# Patient Record
Sex: Female | Born: 1975 | Race: Black or African American | Hispanic: No | Marital: Single | Smoking: Never smoker
Health system: Southern US, Community
[De-identification: ages and names within clinical notes are randomized; demographics above are authoritative.]

## PROBLEM LIST (undated history)

## (undated) DIAGNOSIS — L709 Acne, unspecified: Secondary | ICD-10-CM

## (undated) DIAGNOSIS — L309 Dermatitis, unspecified: Secondary | ICD-10-CM

## (undated) HISTORY — DX: Dermatitis, unspecified: L30.9

## (undated) HISTORY — DX: Acne, unspecified: L70.9

---

## 1999-01-21 ENCOUNTER — Other Ambulatory Visit: Admission: RE | Admit: 1999-01-21 | Discharge: 1999-01-21 | Payer: Self-pay | Admitting: Family Medicine

## 1999-07-05 ENCOUNTER — Other Ambulatory Visit: Admission: RE | Admit: 1999-07-05 | Discharge: 1999-07-05 | Payer: Self-pay | Admitting: Obstetrics and Gynecology

## 2000-01-30 ENCOUNTER — Inpatient Hospital Stay (HOSPITAL_COMMUNITY): Admission: AD | Admit: 2000-01-30 | Discharge: 2000-02-03 | Payer: Self-pay | Admitting: Obstetrics & Gynecology

## 2000-01-30 ENCOUNTER — Inpatient Hospital Stay (HOSPITAL_COMMUNITY): Admission: AD | Admit: 2000-01-30 | Discharge: 2000-01-30 | Payer: Self-pay | Admitting: Obstetrics & Gynecology

## 2000-06-15 ENCOUNTER — Other Ambulatory Visit: Admission: RE | Admit: 2000-06-15 | Discharge: 2000-06-15 | Payer: Self-pay | Admitting: Family Medicine

## 2001-06-11 ENCOUNTER — Other Ambulatory Visit: Admission: RE | Admit: 2001-06-11 | Discharge: 2001-06-11 | Payer: Self-pay | Admitting: Family Medicine

## 2002-08-27 ENCOUNTER — Other Ambulatory Visit: Admission: RE | Admit: 2002-08-27 | Discharge: 2002-08-27 | Payer: Self-pay | Admitting: Family Medicine

## 2003-03-24 ENCOUNTER — Emergency Department (HOSPITAL_COMMUNITY): Admission: AD | Admit: 2003-03-24 | Discharge: 2003-03-24 | Payer: Self-pay | Admitting: Family Medicine

## 2003-04-25 ENCOUNTER — Emergency Department (HOSPITAL_COMMUNITY): Admission: AD | Admit: 2003-04-25 | Discharge: 2003-04-25 | Payer: Self-pay | Admitting: Family Medicine

## 2003-09-16 ENCOUNTER — Other Ambulatory Visit: Admission: RE | Admit: 2003-09-16 | Discharge: 2003-09-16 | Payer: Self-pay | Admitting: Family Medicine

## 2004-09-14 ENCOUNTER — Other Ambulatory Visit: Admission: RE | Admit: 2004-09-14 | Discharge: 2004-09-14 | Payer: Self-pay | Admitting: Family Medicine

## 2005-02-10 ENCOUNTER — Emergency Department (HOSPITAL_COMMUNITY): Admission: EM | Admit: 2005-02-10 | Discharge: 2005-02-10 | Payer: Self-pay | Admitting: Emergency Medicine

## 2005-11-15 ENCOUNTER — Other Ambulatory Visit: Admission: RE | Admit: 2005-11-15 | Discharge: 2005-11-15 | Payer: Self-pay | Admitting: Family Medicine

## 2007-01-18 ENCOUNTER — Other Ambulatory Visit: Admission: RE | Admit: 2007-01-18 | Discharge: 2007-01-18 | Payer: Self-pay | Admitting: Family Medicine

## 2007-02-20 ENCOUNTER — Telehealth (INDEPENDENT_AMBULATORY_CARE_PROVIDER_SITE_OTHER): Payer: Self-pay | Admitting: *Deleted

## 2007-04-17 ENCOUNTER — Ambulatory Visit: Payer: Self-pay | Admitting: Internal Medicine

## 2008-01-18 ENCOUNTER — Other Ambulatory Visit: Admission: RE | Admit: 2008-01-18 | Discharge: 2008-01-18 | Payer: Self-pay | Admitting: Family Medicine

## 2008-05-18 ENCOUNTER — Emergency Department (HOSPITAL_COMMUNITY): Admission: EM | Admit: 2008-05-18 | Discharge: 2008-05-18 | Payer: Self-pay | Admitting: Family Medicine

## 2009-01-20 ENCOUNTER — Other Ambulatory Visit: Admission: RE | Admit: 2009-01-20 | Discharge: 2009-01-20 | Payer: Self-pay | Admitting: Family Medicine

## 2009-09-16 ENCOUNTER — Ambulatory Visit: Payer: Self-pay | Admitting: Nurse Practitioner

## 2009-09-16 DIAGNOSIS — B354 Tinea corporis: Secondary | ICD-10-CM | POA: Insufficient documentation

## 2010-02-17 ENCOUNTER — Other Ambulatory Visit: Admission: RE | Admit: 2010-02-17 | Discharge: 2010-02-17 | Payer: Self-pay | Admitting: Family Medicine

## 2010-03-17 ENCOUNTER — Ambulatory Visit: Payer: Self-pay | Admitting: Internal Medicine

## 2010-07-08 NOTE — Letter (Signed)
Summary: Handout Printed  Printed Handout:  - Ringworm - Body (Tinea Corporis) 

## 2010-07-08 NOTE — Assessment & Plan Note (Signed)
Summary: Acute - Tinea Corporis   Vital Signs:  Patient profile:   35 year old female Height:      64 inches Weight:      118 pounds BMI:     20.33 Temp:     98.2 degrees F oral Pulse rate:   72 / minute Pulse rhythm:   regular Resp:     18 per minute BP sitting:   102 / 68  (left arm) Cuff size:   regular  Vitals Entered By: [place] Shannon (September 16, 2009 12:28 PM) CC: pt says she has a rash/ring worm.... pt says she noticed the rash last week... pt says it don't itch..., Rash Is Patient Diabetic? No Pain Assessment Patient in pain? no       Does patient need assistance? Functional Status Self care Ambulation Normal   CC:  pt says she has a rash/ring worm.... pt says she noticed the rash last week... pt says it don't itch... and Rash.  History of Present Illness:  Pt into the office with c/o rash  Rash      This is a 35 year old woman who presents with Rash.  The symptoms began 1 week ago.  The patient denies redness and tenderness.  The rash is located on the abdomen.  Associated symptoms does not include abdominal pain.  The patient denies the following symptoms: fever, facial swelling, tongue swelling, and difficulty breathing.  She has not used any otc medications for the rash.  Allergies (verified): No Known Drug Allergies  Review of Systems CV:  Denies chest pain or discomfort. Resp:  Denies cough. GI:  Denies abdominal pain. Derm:  Complains of lesion(s).  Physical Exam  General:  alert.   Head:  normocephalic.   Lungs:  normal breath sounds.   Heart:  normal rate and regular rhythm.   Skin:  left flank  5cm - circumscribed lesion, borders erythematous with flaking, central clearing   Impression & Recommendations:  Problem # 1:  TINEA CORPORIS (ICD-110.5) advised pt of dx may use otc cream to affected area - needs to use at least daily  Complete Medication List: 1)  Epipen 0.3 Mg/0.58ml (1:1000) Devi (Epinephrine hcl (anaphylaxis)) .... Use as  directed as needed for severe reaction to bee sting 2)  Lotrimin Af 1 % Crea (Clotrimazole) .... One application topically for 2 weeks  Patient Instructions: 1)  Use lotrimin Cream daily to area for the next 2 weeks 2)  You can purchase this over the counter 3)  Follow up for a complete physical exam. 4)  Come fasting after midnight before this appointment

## 2010-10-22 NOTE — Op Note (Signed)
[facility]  Patient:    Connie [name], Connie [name]                        MRN: [id number] Proc. Date: 01/31/00 Adm. Date:  [id number] Disc. Date: [id number] Attending:  Cleatrice Burke                           Operative Report  PREOPERATIVE DIAGNOSES:       1. Intrauterine pregnancy term.                               2. Second stage arrest in dilatation and descent.  POSTOPERATIVE DIAGNOSES:      1. Intrauterine pregnancy term.                               2. Second stage arrest in dilatation and descent.                               3. Occiput posterior and cephalopelvic disproportion.  OPERATION PERFORMED:          Primary low cervical transverse cesarean section.  SURGEON:                      Georgina Peer, M.D.  ASSISTANT:                    ______ Andrey Campanile, CNM.  ESTIMATED BLOOD LOSS:         500 cc.  ANESTHESIA:                   Spinal.  Dr. Arby Barrette.  FINDINGS:                     A 9 pounds 0 ounce female.  Apgars 9 and 9. Normal tubes and ovaries.  INDICATION:                   A 35 year old gravida 3, para 0-0-2-0, [facility] January 28, 2000.  Presented in early labor.  Contractions were augmented with Pitocin.  She reached full dilatation and approximately 11 a.m. pushed until 1330 without descent of the head past +1 station.  There was ______ molding, an android pelvis, and a suspect CPD.  After informed consent the patient was taken to the operating room for cesarean section.  Risks and complications including bleeding, infection, increased blood clot risk, injury to abdominal structures was all discussed and accepted.  DESCRIPTION OF PROCEDURE:     Patient was taken to the operating room, given a spinal by Dr. Arby Barrette, placed in the dorsal supine left lateral uterine displacement position.  Foley catheter put in place in her bladder.  Abdomen was prepped and draped sterilely.  After adequate anesthesia was documented, a transverse  incision was made through the subcutaneous tissue, skin, and fascia.  The abdomen was entered in the normal Pfannenstiel manner.  A transverse bladder flap was created.  A transverse uterine incision was also created.  What appeared to have been meconium stained fluid was felt to be almost negligible at the time of the uterine incision.  The incision was widened with bandage scissors and a female infant was delivered from the OP position, rotated to  OA, and delivered with the aid of fundal pressure.  Mouth and nose were suctioned with ______ trap and bulb suctioned.  Infant cried spontaneously.  The cord was doubly clamped and cut.  The infant was handed to the pediatric team in attendance.  Cord blood was taken.  Placenta and membranes removed.  The uterus contracted well with Pitocin.  There were no extensions.  The uterus was closed in two layers of running locked Vicryl with a second embrocating suture of Vicryl.  Tubes and ovaries inspected and found to be normal.  Bleeding along the edges of the incision were controlled with interrupted and figure-of-eight sutures of Vicryl.  The rectus muscles were approximated in the midline with a Vicryl suture to cover the bladder.  The fascia was closed with Vicryl suture with the knots buried.  Subcutaneous tissue cauterized for any bleeders.  Skin was closed with staples after the subcu was copiously irrigated with saline.  Sponge, needle, and instrument counts were correct.  Patient tolerated the procedure well and was sent to recovery area in stable condition.  The infant went to regular nursery. DD:  01/31/00 TD:  01/31/00 Job: [id number]

## 2010-10-22 NOTE — Discharge Summary (Signed)
[facility]  Patient:    Connie [name], Connie [name]                        MRN: [id number] Adm. Date:  [id number] Disc. Date: [id number] Attending:  Cleatrice Burke Dictator:   Wynelle Bourgeois, C.N.M.                           Discharge Summary  ADMISSION DIAGNOSES:          1. Intrauterine pregnancy at 40-2/7 weeks.                               2. Labor.                               3. Group B Streptococcus unknown.  DISCHARGE DIAGNOSES:          1. Intrauterine pregnancy at 40-2/7 weeks.                               2. Labor.                               3. Group B Streptococcus unknown.                               4. Primary low transverse cesarean section.                               5. Second stage arrest.                               6. Occipitoposterior presentation.  PROCEDURES:                   1. Amniotomy.                               2. IUPC.                               3. Primary low transverse cesarean section.                               4. Spinal anesthesia.  HOSPITAL COURSE:              The patient is a 35 year old G3, P0-0-2-0 at 40-2/7 weeks who presented in labor on January 30, 2000, at 3 to 4 cm and 0 station.  Membranes were intact at that time, and she was admitted to the physicians service for labor.  She utilized IV pain medicines during labor and progressed well to 5 cm at 4:15 a.m. at which time amniotomy as performed and IUPC was placed.  Later on that morning at 8:15 a.m. on August 27, the cervical exam progressed to rim of cervix with -1 station, and Pitocin augmentation was begun.  She pushed for several hours, and the fetal vertex  started to display caput and molding.  An acute suprapubic arch was noted. Dr. Elliot Gault felt the patient had expended good effort without descent of the vertex past +1 station.  Therefore, a diagnosis of active phase arrest was determined, and the patient was taken to the operating room  for a primary low transverse cesarean section for second stage arrest and occipitoposterior position under spinal anesthesia.  EBL was 500 cc, and there were no complications.  Findings included a female infant weighing 9 pounds, and Apgars were 9/9.  The patient was taken to recovery.  Postoperative stay was unremarkable except for a one-time fever on postop day #1 of 102.4.  She was treated with fluids and Tylenol and had no further elevations in her temperature.  She was also placed on Unasyn for antibiotic coverage.  Urine was sent for culture.  On postop day #2, her status was stable.  She had clear lungs and regular heart rate.  Incision was clean and dry.  Lochia was scant.  Extremities were within normal limits.  Temperatures were within normal limits.  On postop day #3, she was without complaint.  She was afebrile with a maximum temperature of 100.1 at midnight.  Abdomen was soft.  Incision was clear.  Physical exam was within normal limits, and she was deemed to have received full benefit of her hospital stay and was discharged to home after her staples were removed by Dr. Pennie Rushing.  DISCHARGE MEDICATIONS:        1. Motrin 600 mg p.o. q.6h. p.r.n.                               2. Tylox 1 to 2 p.o. q.4h. p.r.n.                               3. Hemocyte 1 q.d.                               4. Prenatal vitamins 1 q.d.  DISCHARGE LABORATORY DATA:    RPR nonreactive.  White blood cell count 14.0, hemoglobin 8.1, hematocrit 23.6, platelets 155.  DISCHARGE INSTRUCTIONS:       Per [facility] handout.  DISCHARGE FOLLOWUP:           In six weeks at [facility]. DD:  02/03/00 TD:  02/04/00 Job: [id number]
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
## 2011-02-24 ENCOUNTER — Other Ambulatory Visit: Payer: Self-pay | Admitting: Family Medicine

## 2011-02-24 ENCOUNTER — Other Ambulatory Visit (HOSPITAL_COMMUNITY)
Admission: RE | Admit: 2011-02-24 | Discharge: 2011-02-24 | Disposition: A | Discharge status: Home / Self Care | Payer: Medicaid Other | Source: Ambulatory Visit | Attending: Family Medicine | Admitting: Family Medicine

## 2011-02-24 DIAGNOSIS — Z124 Encounter for screening for malignant neoplasm of cervix: Secondary | ICD-10-CM | POA: Insufficient documentation

## 2011-03-11 LAB — GC/CHLAMYDIA PROBE AMP, GENITAL
Chlamydia, DNA Probe: NEGATIVE
GC Probe Amp, Genital: NEGATIVE

## 2011-03-11 LAB — POCT PREGNANCY, URINE: Preg Test, Ur: NEGATIVE

## 2011-03-11 LAB — POCT URINALYSIS DIP (DEVICE)
Glucose, UA: NEGATIVE mg/dL
Nitrite: NEGATIVE
Specific Gravity, Urine: 1.025 (ref 1.005–1.030)
Urobilinogen, UA: 0.2 mg/dL (ref 0.0–1.0)

## 2011-03-11 LAB — WET PREP, GENITAL
Trich, Wet Prep: NONE SEEN
Yeast Wet Prep HPF POC: NONE SEEN

## 2012-02-27 ENCOUNTER — Other Ambulatory Visit: Payer: Self-pay | Admitting: Family Medicine

## 2012-02-27 ENCOUNTER — Other Ambulatory Visit (HOSPITAL_COMMUNITY)
Admission: RE | Admit: 2012-02-27 | Discharge: 2012-02-27 | Disposition: A | Discharge status: Home / Self Care | Payer: Medicaid Other | Source: Ambulatory Visit | Attending: Family Medicine | Admitting: Family Medicine

## 2012-02-27 DIAGNOSIS — Z124 Encounter for screening for malignant neoplasm of cervix: Secondary | ICD-10-CM | POA: Insufficient documentation

## 2013-03-19 ENCOUNTER — Other Ambulatory Visit: Payer: Self-pay | Admitting: Physician Assistant

## 2013-03-19 ENCOUNTER — Other Ambulatory Visit (HOSPITAL_COMMUNITY)
Admission: RE | Admit: 2013-03-19 | Discharge: 2013-03-19 | Disposition: A | Discharge status: Home / Self Care | Payer: Medicaid Other | Source: Ambulatory Visit | Attending: Family Medicine | Admitting: Family Medicine

## 2013-03-19 DIAGNOSIS — Z124 Encounter for screening for malignant neoplasm of cervix: Secondary | ICD-10-CM | POA: Insufficient documentation

## 2016-03-28 ENCOUNTER — Other Ambulatory Visit: Payer: Self-pay | Admitting: Family Medicine

## 2016-03-28 ENCOUNTER — Other Ambulatory Visit (HOSPITAL_COMMUNITY)
Admission: RE | Admit: 2016-03-28 | Discharge: 2016-03-28 | Disposition: A | Discharge status: Home / Self Care | Payer: BLUE CROSS/BLUE SHIELD | Source: Ambulatory Visit | Attending: Family Medicine | Admitting: Family Medicine

## 2016-03-28 DIAGNOSIS — Z124 Encounter for screening for malignant neoplasm of cervix: Secondary | ICD-10-CM | POA: Insufficient documentation

## 2016-03-29 ENCOUNTER — Other Ambulatory Visit: Payer: Self-pay | Admitting: Family Medicine

## 2016-03-29 DIAGNOSIS — Z0000 Encounter for general adult medical examination without abnormal findings: Secondary | ICD-10-CM

## 2016-03-30 LAB — CYTOLOGY - PAP: DIAGNOSIS: NEGATIVE

## 2016-04-06 ENCOUNTER — Ambulatory Visit
Admission: RE | Admit: 2016-04-06 | Discharge: 2016-04-06 | Disposition: A | Discharge status: Home / Self Care | Payer: BLUE CROSS/BLUE SHIELD | Source: Ambulatory Visit | Attending: Family Medicine | Admitting: Family Medicine

## 2016-04-06 DIAGNOSIS — Z0000 Encounter for general adult medical examination without abnormal findings: Secondary | ICD-10-CM

## 2017-01-18 ENCOUNTER — Other Ambulatory Visit: Payer: Self-pay | Admitting: Family Medicine

## 2017-01-18 DIAGNOSIS — Z1231 Encounter for screening mammogram for malignant neoplasm of breast: Secondary | ICD-10-CM

## 2017-04-10 ENCOUNTER — Ambulatory Visit: Payer: BLUE CROSS/BLUE SHIELD

## 2017-04-20 ENCOUNTER — Ambulatory Visit: Payer: BLUE CROSS/BLUE SHIELD

## 2017-04-25 ENCOUNTER — Ambulatory Visit
Admission: RE | Admit: 2017-04-25 | Discharge: 2017-04-25 | Disposition: A | Discharge status: Home / Self Care | Payer: BLUE CROSS/BLUE SHIELD | Source: Ambulatory Visit | Attending: Family Medicine | Admitting: Family Medicine

## 2017-04-25 DIAGNOSIS — Z1231 Encounter for screening mammogram for malignant neoplasm of breast: Secondary | ICD-10-CM

## 2018-05-24 ENCOUNTER — Other Ambulatory Visit: Payer: Self-pay | Admitting: Family Medicine

## 2018-05-24 DIAGNOSIS — Z1231 Encounter for screening mammogram for malignant neoplasm of breast: Secondary | ICD-10-CM

## 2018-05-28 ENCOUNTER — Ambulatory Visit
Admission: RE | Admit: 2018-05-28 | Discharge: 2018-05-28 | Disposition: A | Discharge status: Home / Self Care | Payer: BLUE CROSS/BLUE SHIELD | Source: Ambulatory Visit | Attending: Family Medicine | Admitting: Family Medicine

## 2018-05-28 DIAGNOSIS — Z1231 Encounter for screening mammogram for malignant neoplasm of breast: Secondary | ICD-10-CM

## 2018-05-31 ENCOUNTER — Ambulatory Visit: Payer: BLUE CROSS/BLUE SHIELD

## 2018-07-31 ENCOUNTER — Encounter (HOSPITAL_COMMUNITY): Payer: Self-pay | Admitting: Emergency Medicine

## 2018-07-31 ENCOUNTER — Other Ambulatory Visit: Payer: Self-pay

## 2018-07-31 ENCOUNTER — Emergency Department (HOSPITAL_COMMUNITY)
Admission: EM | Admit: 2018-07-31 | Discharge: 2018-07-31 | Disposition: A | Discharge status: Home / Self Care | Payer: BLUE CROSS/BLUE SHIELD | Attending: Emergency Medicine | Admitting: Emergency Medicine

## 2018-07-31 DIAGNOSIS — Z209 Contact with and (suspected) exposure to unspecified communicable disease: Secondary | ICD-10-CM | POA: Insufficient documentation

## 2018-07-31 DIAGNOSIS — J069 Acute upper respiratory infection, unspecified: Secondary | ICD-10-CM

## 2018-07-31 LAB — INFLUENZA PANEL BY PCR (TYPE A & B)
INFLAPCR: NEGATIVE
Influenza B By PCR: NEGATIVE

## 2018-07-31 LAB — GROUP A STREP BY PCR: Group A Strep by PCR: NOT DETECTED

## 2018-07-31 MED ORDER — BENZONATATE 100 MG PO CAPS: 100.0000 mg | ORAL_CAPSULE | Freq: Three times a day (TID) | ORAL | 0 refills | Status: DC

## 2018-07-31 NOTE — ED Provider Notes (Signed)
[facility]-EMERGENCY DEPT Provider Note   CSN: [id number] Arrival date & time: 07/31/18  1206    History   Chief Complaint Chief Complaint  Patient presents with  . Generalized Body Aches  . Cough    HPI Connie [name] is a 43 y.o. female who presents to the ED with complaint of cough and congestion, chills and fever that started 2 days ago and is getting worse. The cough is productive. 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      The history is provided by the patient. No language interpreter was used.  Cough  Cough characteristics:  Productive Sputum characteristics:  White Severity:  Moderate Onset quality:  Gradual Duration:  2 days Progression:  Worsening Chronicity:  New Smoker: no   Context: sick contacts   Relieved by:  Nothing Worsened by:  Nothing Ineffective treatments: OTC medications. Associated symptoms: chills, headaches, myalgias and sore throat   Associated symptoms: no ear pain, no fever, no rash, no shortness of breath, no sinus congestion and no wheezing     History reviewed. No pertinent past medical history.  Patient Active Problem List   Diagnosis Date Noted  . TINEA CORPORIS 09/16/2009    History reviewed. No pertinent surgical history.   OB History   No obstetric history on file.      Home Medications    Prior to Admission medications   Medication Sig Start Date End Date Taking? Authorizing Provider  benzonatate (TESSALON) 100 MG capsule Take 1 capsule (100 mg total) by mouth every 8 (eight) hours. 07/31/18   Janne Napoleon, NP    Family History Family History  Problem Relation Age of Onset  . Breast cancer Maternal Grandmother     Social History Social History   Tobacco Use  . Smoking status: Not on file  Substance Use Topics  . Alcohol use: Not on file  . Drug use: Not on file     Allergies   Patient has no known allergies.   Review of Systems Review of Systems    Constitutional: Positive for chills. Negative for fever.  HENT: Positive for congestion and sore throat. Negative for ear pain.   Eyes: Negative for visual disturbance.  Respiratory: Positive for cough. Negative for shortness of breath and wheezing.   Gastrointestinal: Negative for abdominal pain, diarrhea, nausea and vomiting.  Genitourinary: Negative for dysuria, frequency and urgency.  Musculoskeletal: Positive for myalgias.  Skin: Negative for rash.  Neurological: Positive for headaches.  Psychiatric/Behavioral: Negative for confusion.     Physical Exam Updated Vital Signs BP 101/78 (BP Location: Left Arm)   Pulse (!) 110   Temp 98.3 F (36.8 C) (Oral)   Resp 17   Ht 5\' 4"  (1.626 m)   Wt 52.2 kg   LMP 07/12/2018   SpO2 100%   BMI 19.74 kg/m   Physical Exam Vitals signs and nursing note reviewed.  Constitutional:      General: She is not in acute distress.    Appearance: She is well-developed.  HENT:     Head: Normocephalic and atraumatic.     Right Ear: Tympanic membrane normal.     Left Ear: Tympanic membrane normal.     Nose: Congestion present.     Mouth/Throat:     Mouth: Mucous membranes are moist.  Eyes:     Extraocular Movements: Extraocular movements intact.     Conjunctiva/sclera: Conjunctivae normal.  Neck:  Musculoskeletal: Neck supple.  Cardiovascular:     Rate and Rhythm: Normal rate and regular rhythm.  Pulmonary:     Effort: Pulmonary effort is normal. No respiratory distress.     Breath sounds: No wheezing or rales.  Abdominal:     Palpations: Abdomen is soft.     Tenderness: There is no abdominal tenderness.  Musculoskeletal: Normal range of motion.  Skin:    General: Skin is warm and dry.  Neurological:     Mental Status: She is alert and oriented to person, place, and time.  Psychiatric:        Mood and Affect: Mood normal.      ED Treatments / Results  Labs (all labs ordered are listed, but only abnormal results are  displayed) Labs Reviewed  GROUP A STREP BY PCR  INFLUENZA PANEL BY PCR (TYPE A & B)  Radiology No results found.  Procedures Procedures (including critical care time)  Medications Ordered in ED Medications - No data to display   Initial Impression / Assessment and Plan / ED Course  I have reviewed the triage vital signs and the nursing notes. Patients symptoms are consistent with URI, likely viral etiology. Discussed that antibiotics are not indicated for viral infections. Pt will be discharged with symptomatic treatment.  Verbalizes understanding and is agreeable with plan. Pt is hemodynamically stable & in NAD prior to dc.  Final Clinical Impressions(s) / ED Diagnoses   Final diagnoses:  Acute URI    ED Discharge Orders         Ordered    benzonatate (TESSALON) 100 MG capsule  Every 8 hours     07/31/18 1630           Damian Leavell [place], NP 07/31/18 2250    Terrilee Files, MD 08/01/18 [phone]

## 2018-07-31 NOTE — Discharge Instructions (Addendum)
Rest, drink plenty of fluids and follow up with your doctor. Return here for worsening symptoms.

## 2018-07-31 NOTE — ED Triage Notes (Signed)
Patient c/o productive cough, body aches, and chills x2 days.

## 2018-12-24 ENCOUNTER — Other Ambulatory Visit: Payer: Self-pay

## 2018-12-24 ENCOUNTER — Ambulatory Visit (INDEPENDENT_AMBULATORY_CARE_PROVIDER_SITE_OTHER): Payer: Managed Care, Other (non HMO) | Admitting: Internal Medicine

## 2018-12-24 ENCOUNTER — Encounter: Payer: Self-pay | Admitting: Internal Medicine

## 2018-12-24 VITALS — BP 102/64 | HR 73 | Temp 98.5°F | Ht 63.5 in | Wt 116.0 lb

## 2018-12-24 DIAGNOSIS — E559 Vitamin D deficiency, unspecified: Secondary | ICD-10-CM | POA: Diagnosis not present

## 2018-12-24 DIAGNOSIS — Z0000 Encounter for general adult medical examination without abnormal findings: Secondary | ICD-10-CM

## 2018-12-24 DIAGNOSIS — Z23 Encounter for immunization: Secondary | ICD-10-CM

## 2018-12-24 NOTE — Progress Notes (Signed)
HPI  Patient presents to the clinic today to establish care.  She was seen by Dr. Valentina LucksGriffin at [facility] previously.    Flu: 02/2018 Tetanus: unsure Pap [facility] 03[facility] Mammogram: 05/2018 Vision: as needed Dentist: Biannually  Diet: She does eat meat. She consumes fruits and veggies daily. She does eat fried foods .She drinks mostly water. Exercise: None, ride bike 4 x week  No past medical history on file.  Current Outpatient Medications  Medication Sig Dispense Refill  . Ascorbic Acid (VITAMIN C) 1000 MG tablet Take 1,000 mg by mouth daily.     No current facility-administered medications for this visit.     No Known Allergies  Family History  Problem Relation Age of Onset  . Breast cancer Maternal Grandmother   . Thyroid cancer Mother   . Hypertension Father     Social History   Socioeconomic History  . Marital status: Single    Spouse name: Not on file  . Number of children: Not on file  . Years of education: Not on file  . Highest education level: Not on file  Occupational History  . Not on file  Social Needs  . Financial resource strain: Not on file  . Food insecurity    Worry: Not on file    Inability: Not on file  . Transportation needs    Medical: Not on file    Non-medical: Not on file  Tobacco Use  . Smoking status: Never Smoker  . Smokeless tobacco: Never Used  Substance and Sexual Activity  . Alcohol use: Yes    Comment: occasional  . Drug use: Never  . Sexual activity: Not on file  Lifestyle  . Physical activity    Days per week: Not on file    Minutes per session: Not on file  . Stress: Not on file  Relationships  . Social Musicianconnections    Talks on phone: Not on file    Gets together: Not on file    Attends religious service: Not on file    Active member of club or organization: Not on file    Attends meetings of clubs or organizations: Not on file    Relationship status: Not on file  . Intimate partner violence   Fear of current or ex partner: Not on file    Emotionally abused: Not on file    Physically abused: Not on file    Forced sexual activity: Not on file  Other Topics Concern  . Not on file  Social History Narrative  . Not on file    ROS:  Constitutional: Denies fever, malaise, fatigue, headache or abrupt weight changes.  HEENT: Denies eye pain, eye redness, ear pain, ringing in the ears, wax buildup, runny nose, nasal congestion, bloody nose, or sore throat. Respiratory: Denies difficulty breathing, shortness of breath, cough or sputum production.   Cardiovascular: Denies chest pain, chest tightness, palpitations or swelling in the hands or feet.  Gastrointestinal: Denies abdominal pain, bloating, constipation, diarrhea or blood in the stool.  GU: Denies frequency, urgency, pain with urination, blood in urine, odor or discharge. Musculoskeletal: Denies decrease in range of motion, difficulty with gait, muscle pain or joint pain and swelling.  Skin: Denies redness, rashes, lesions or ulcercations.  Neurological: Denies dizziness, difficulty with memory, difficulty with speech or problems with balance and coordination.  Psych: Denies anxiety, depression, SI/HI.  No other specific complaints in a complete review of systems (except as listed in HPI above).  PE:  BP 102/64   Pulse 73   Temp 98.5 F (36.9 C) (Temporal)   Ht 5' 3.5" (1.613 m)   Wt 116 lb (52.6 kg)   LMP 12/22/2018   SpO2 97%   BMI 20.23 kg/m  Wt Readings from Last 3 Encounters:  12/24/18 116 lb (52.6 kg)  07/31/18 115 lb (52.2 kg)    General: Appears her stated age, well developed, well nourished in NAD. HEENT: Head: normal shape and size; Eyes: sclera white, no icterus, conjunctiva pink, PERRLA and EOMs intact; Ears: Tm's gray and intact, normal light reflex; Neck: Neck supple, trachea midline. No masses, lumps or thyromegaly present.  Cardiovascular: Normal rate and rhythm. S1,S2 noted.  No murmur, rubs or  gallops noted. No JVD or BLE edema.  Pulmonary/Chest: Normal effort and positive vesicular breath sounds. No respiratory distress. No wheezes, rales or ronchi noted.  Abdomen: Soft and nontender. Normal bowel sounds, no bruits noted. No distention or masses noted. Liver, spleen and kidneys non palpable. Musculoskeletal:  Strength 5/5 BUE/BLE. No signs of joint swelling. No difficulty with gait.  Neurological: Alert and oriented. Cranial nerves II-XII grossly intact. Coordination normal.  Psychiatric: Mood and affect normal. Behavior is normal. Judgment and thought content normal.    Assessment and Plan:  Preventative Health Maintenance:  Flu shot UTD Tetanus given today. Pap smear UTD- due in 2022. Mammogram due 05/2019. Encouraged patient to see dentist biannually and eye doctor as needed. Continue to eat a balanced diet and exercise routinely. CBC, CMP, Lipids, Vitamin D drawn today.  Return in 1 year for annual physical exam or sooner if needed.  Webb Silversmith, NP

## 2018-12-24 NOTE — Patient Instructions (Signed)
Health Maintenance, Female Adopting a healthy lifestyle and getting preventive care are important in promoting health and wellness. Ask your health care provider about:  The right schedule for you to have regular tests and exams.  Things you can do on your own to prevent diseases and keep yourself healthy. What should I know about diet, weight, and exercise? Eat a healthy diet   Eat a diet that includes plenty of vegetables, fruits, low-fat dairy products, and lean protein.  Do not eat a lot of foods that are high in solid fats, added sugars, or sodium. Maintain a healthy weight Body mass index (BMI) is used to identify weight problems. It estimates body fat based on height and weight. Your health care provider can help determine your BMI and help you achieve or maintain a healthy weight. Get regular exercise Get regular exercise. This is one of the most important things you can do for your health. Most adults should:  Exercise for at least 150 minutes each week. The exercise should increase your heart rate and make you sweat (moderate-intensity exercise).  Do strengthening exercises at least twice a week. This is in addition to the moderate-intensity exercise.  Spend less time sitting. Even light physical activity can be beneficial. Watch cholesterol and blood lipids Have your blood tested for lipids and cholesterol at 42 years of age, then have this test every 5 years. Have your cholesterol levels checked more often if:  Your lipid or cholesterol levels are high.  You are older than 42 years of age.  You are at high risk for heart disease. What should I know about cancer screening? Depending on your health history and family history, you may need to have cancer screening at various ages. This may include screening for:  Breast cancer.  Cervical cancer.  Colorectal cancer.  Skin cancer.  Lung cancer. What should I know about heart disease, diabetes, and high blood  pressure? Blood pressure and heart disease  High blood pressure causes heart disease and increases the risk of stroke. This is more likely to develop in people who have high blood pressure readings, are of African descent, or are overweight.  Have your blood pressure checked: ? Every 3-5 years if you are 18-39 years of age. ? Every year if you are 40 years old or older. Diabetes Have regular diabetes screenings. This checks your fasting blood sugar level. Have the screening done:  Once every three years after age 40 if you are at a normal weight and have a low risk for diabetes.  More often and at a younger age if you are overweight or have a high risk for diabetes. What should I know about preventing infection? Hepatitis B If you have a higher risk for hepatitis B, you should be screened for this virus. Talk with your health care provider to find out if you are at risk for hepatitis B infection. Hepatitis C Testing is recommended for:  Everyone born from 1945 through 1965.  Anyone with known risk factors for hepatitis C. Sexually transmitted infections (STIs)  Get screened for STIs, including gonorrhea and chlamydia, if: ? You are sexually active and are younger than 42 years of age. ? You are older than 42 years of age and your health care provider tells you that you are at risk for this type of infection. ? Your sexual activity has changed since you were last screened, and you are at increased risk for chlamydia or gonorrhea. Ask your health care provider if   you are at risk.  Ask your health care provider about whether you are at high risk for HIV. Your health care provider may recommend a prescription medicine to help prevent HIV infection. If you choose to take medicine to prevent HIV, you should first get tested for HIV. You should then be tested every 3 months for as long as you are taking the medicine. Pregnancy  If you are about to stop having your period (premenopausal) and  you may become pregnant, seek counseling before you get pregnant.  Take 400 to 800 micrograms (mcg) of folic acid every day if you become pregnant.  Ask for birth control (contraception) if you want to prevent pregnancy. Osteoporosis and menopause Osteoporosis is a disease in which the bones lose minerals and strength with aging. This can result in bone fractures. If you are 65 years old or older, or if you are at risk for osteoporosis and fractures, ask your health care provider if you should:  Be screened for bone loss.  Take a calcium or vitamin D supplement to lower your risk of fractures.  Be given hormone replacement therapy (HRT) to treat symptoms of menopause. Follow these instructions at home: Lifestyle  Do not use any products that contain nicotine or tobacco, such as cigarettes, e-cigarettes, and chewing tobacco. If you need help quitting, ask your health care provider.  Do not use street drugs.  Do not share needles.  Ask your health care provider for help if you need support or information about quitting drugs. Alcohol use  Do not drink alcohol if: ? Your health care provider tells you not to drink. ? You are pregnant, may be pregnant, or are planning to become pregnant.  If you drink alcohol: ? Limit how much you use to 0-1 drink a day. ? Limit intake if you are breastfeeding.  Be aware of how much alcohol is in your drink. In the [place] one drink equals one 12 oz bottle of beer (355 mL), one 5 oz glass of wine (148 mL), or one 1 oz glass of hard liquor (44 mL). General instructions  Schedule regular health, dental, and eye exams.  Stay current with your vaccines.  Tell your health care provider if: ? You often feel depressed. ? You have ever been abused or do not feel safe at home. Summary  Adopting a healthy lifestyle and getting preventive care are important in promoting health and wellness.  Follow your health care provider's instructions about healthy  diet, exercising, and getting tested or screened for diseases.  Follow your health care provider's instructions on monitoring your cholesterol and blood pressure. This information is not intended to replace advice given to you by your health care provider. Make sure you discuss any questions you have with your health care provider. Document Released: 12/06/2010 Document Revised: 05/16/2018 Document Reviewed: 05/16/2018 Elsevier Patient Education  2020 Else[employer] Inc.  

## 2018-12-25 LAB — CBC
HCT: 35.6 % — ABNORMAL LOW (ref 36.0–46.0)
Hemoglobin: 12.2 g/dL (ref 12.0–15.0)
MCHC: 34.2 g/dL (ref 30.0–36.0)
MCV: 97.2 fl (ref 78.0–100.0)
Platelets: 217 10*3/uL (ref 150.0–400.0)
RBC: 3.67 Mil/uL — ABNORMAL LOW (ref 3.87–5.11)
RDW: 12 % (ref 11.5–15.5)
WBC: 5.5 10*3/uL (ref 4.0–10.5)

## 2018-12-25 LAB — COMPREHENSIVE METABOLIC PANEL
ALT: 11 U/L (ref 0–35)
AST: 16 U/L (ref 0–37)
Albumin: 4.3 g/dL (ref 3.5–5.2)
Alkaline Phosphatase: 48 U/L (ref 39–117)
BUN: 12 mg/dL (ref 6–23)
CO2: 28 mEq/L (ref 19–32)
Calcium: 9 mg/dL (ref 8.4–10.5)
Chloride: 105 mEq/L (ref 96–112)
Creatinine, Ser: 0.85 mg/dL (ref 0.40–1.20)
GFR: 88.81 mL/min (ref >60.00)
Glucose, Bld: 101 mg/dL — ABNORMAL HIGH (ref 70–99)
Potassium: 3.6 mEq/L (ref 3.5–5.1)
Sodium: 138 mEq/L (ref 135–145)
Total Bilirubin: 0.5 mg/dL (ref 0.2–1.2)
Total Protein: 7.5 g/dL (ref 6.0–8.3)

## 2018-12-25 LAB — VITAMIN D 25 HYDROXY (VIT D DEFICIENCY, FRACTURES): VITD: 18.62 ng/mL — ABNORMAL LOW (ref 30.00–100.00)

## 2018-12-25 LAB — LIPID PANEL
Cholesterol: 160 mg/dL (ref 0–200)
HDL: 57.7 mg/dL (ref >39.00)
LDL Cholesterol: 93 mg/dL (ref 0–99)
NonHDL: 102.74
Total CHOL/HDL Ratio: 3
Triglycerides: 47 mg/dL (ref 0.0–149.0)
VLDL: 9.4 mg/dL (ref 0.0–40.0)

## 2018-12-26 MED ORDER — VITAMIN D (ERGOCALCIFEROL) 1.25 MG (50000 UNIT) PO CAPS: 50000.0000 [IU] | ORAL_CAPSULE | ORAL | 0 refills | Status: DC

## 2018-12-26 NOTE — Addendum Note (Signed)
Addended by: Jearld Fenton on: 12/26/2018 11:22 AM   Modules accepted: Orders

## 2019-01-09 NOTE — Addendum Note (Signed)
Addended by: Lurlean Nanny on: 01/09/2019 11:58 AM   Modules accepted: Orders

## 2019-02-05 ENCOUNTER — Telehealth: Payer: Self-pay | Admitting: Internal Medicine

## 2019-02-05 ENCOUNTER — Ambulatory Visit (INDEPENDENT_AMBULATORY_CARE_PROVIDER_SITE_OTHER): Payer: Managed Care, Other (non HMO)

## 2019-02-05 DIAGNOSIS — Z23 Encounter for immunization: Secondary | ICD-10-CM | POA: Diagnosis not present

## 2019-02-05 NOTE — Telephone Encounter (Signed)
Patient wants to know if she's due for a pap smear.  Patient said her last pap smear was 03/28/16.

## 2019-02-07 NOTE — Telephone Encounter (Signed)
Pt is aware that pap is every 5 yrs-- last 2017

## 2019-02-07 NOTE — Telephone Encounter (Signed)
Pt is calling back and wanting to know the answer to question?  CB [phone]

## 2019-02-12 ENCOUNTER — Telehealth: Payer: Self-pay | Admitting: Internal Medicine

## 2019-02-12 NOTE — Telephone Encounter (Signed)
Patient had a physical on 12/24/18.  Patient needs a note stating patient had a physical.  Please call patient when note is ready and she'll come by and pick up note.

## 2019-02-13 NOTE — Telephone Encounter (Signed)
Letter printed and placed in the front office for pick up, pt is aware

## 2019-02-21 ENCOUNTER — Other Ambulatory Visit: Payer: Self-pay | Admitting: Internal Medicine

## 2019-02-21 DIAGNOSIS — Z1231 Encounter for screening mammogram for malignant neoplasm of breast: Secondary | ICD-10-CM

## 2019-02-27 ENCOUNTER — Ambulatory Visit (INDEPENDENT_AMBULATORY_CARE_PROVIDER_SITE_OTHER): Payer: Managed Care, Other (non HMO) | Admitting: Internal Medicine

## 2019-02-27 ENCOUNTER — Other Ambulatory Visit: Payer: Self-pay

## 2019-02-27 ENCOUNTER — Encounter: Payer: Self-pay | Admitting: Internal Medicine

## 2019-02-27 ENCOUNTER — Other Ambulatory Visit: Payer: Self-pay | Admitting: Internal Medicine

## 2019-02-27 VITALS — BP 98/60 | HR 80 | Temp 98.7°F | Wt 117.0 lb

## 2019-02-27 DIAGNOSIS — N898 Other specified noninflammatory disorders of vagina: Secondary | ICD-10-CM | POA: Diagnosis not present

## 2019-02-27 NOTE — Patient Instructions (Signed)
Vaginitis  Vaginitis is irritation and swelling (inflammation) of the vagina. It happens when normal bacteria and yeast in the vagina grow too much. There are many types of this condition. Treatment will depend on the type you have. Follow these instructions at home: Lifestyle  Keep your vagina area clean and dry. ? Avoid using soap. ? Rinse the area with water.  Do not do the following until your doctor says it is okay: ? Wash and clean out the vagina (douche). ? Use tampons. ? Have sex.  Wipe from front to back after going to the bathroom.  Let air reach your vagina. ? Wear cotton underwear. ? Do not wear: ? Underwear while you sleep. ? Tight pants. ? Thong underwear. ? Underwear or nylons without a cotton panel. ? Take off any wet clothing, such as bathing suits, as soon as possible.  Use gentle, non-scented products. Do not use things that can irritate the vagina, such as fabric softeners. Avoid the following products if they are scented: ? Feminine sprays. ? Detergents. ? Tampons. ? Feminine hygiene products. ? Soaps or bubble baths.  Practice safe sex and use condoms. General instructions  Take over-the-counter and prescription medicines only as told by your doctor.  If you were prescribed an antibiotic medicine, take or use it as told by your doctor. Do not stop taking or using the antibiotic even if you start to feel better.  Keep all follow-up visits as told by your doctor. This is important. Contact a doctor if:  You have pain in your belly.  You have a fever.  Your symptoms last for more than 2-3 days. Get help right away if:  You have a fever and your symptoms get worse all of a sudden. Summary  Vaginitis is irritation and swelling of the vagina. It can happen when the normal bacteria and yeast in the vagina grow too much. There are many types.  Treatment will depend on the type you have.  Do not douche, use tampons , or have sex until your health  care provider approves. When you can return to sex, practice safe sex and use condoms. This information is not intended to replace advice given to you by your health care provider. Make sure you discuss any questions you have with your health care provider. Document Released: 08/19/2008 Document Revised: 05/05/2017 Document Reviewed: 06/14/2016 Elsevier Patient Education  2020 Elsevier Inc.  

## 2019-02-27 NOTE — Progress Notes (Signed)
Subjective:    Patient ID: Connie [name], female    DOB: 30-May-1976, 43 y.o.   MRN: [id number]  HPI  Pt presents to the clinic today with c/o vaginal discharge and itching. She noticed this 4 days ago. She reports the discharge is thick, creamy in color without odor. She denies pelvic pain, pain with intercourse, abnormal vaginal bleeding. She denies urinary symptoms. She has not been sexually active in 9 months, and is not concerned that this could be an STD. She has not tried any medications OTC for this. She denies recent antibiotic use, bubble baths or douching.  Review of Systems      No past medical history on file.  Current Outpatient Medications  Medication Sig Dispense Refill  . Ascorbic Acid (VITAMIN C) 1000 MG tablet Take 1,000 mg by mouth daily.    . Vitamin D, Ergocalciferol, (DRISDOL) 1.25 MG (50000 UT) CAPS capsule Take 1 capsule (50,000 Units total) by mouth every 7 (seven) days. 12 capsule 0   No current facility-administered medications for this visit.     No Known Allergies  Family History  Problem Relation Age of Onset  . Breast cancer Maternal Grandmother   . Thyroid cancer Mother   . Hypertension Father     Social History   Socioeconomic History  . Marital status: Single    Spouse name: Not on file  . Number of children: Not on file  . Years of education: Not on file  . Highest education level: Not on file  Occupational History  . Not on file  Social Needs  . Financial resource strain: Not on file  . Food insecurity    Worry: Not on file    Inability: Not on file  . Transportation needs    Medical: Not on file    Non-medical: Not on file  Tobacco Use  . Smoking status: Never Smoker  . Smokeless tobacco: Never Used  Substance and Sexual Activity  . Alcohol use: Yes    Comment: occasional  . Drug use: Never  . Sexual activity: Not on file  Lifestyle  . Physical activity    Days per week: Not on file    Minutes per session: Not on file   . Stress: Not on file  Relationships  . Social Herbalist on phone: Not on file    Gets together: Not on file    Attends religious service: Not on file    Active member of club or organization: Not on file    Attends meetings of clubs or organizations: Not on file    Relationship status: Not on file  . Intimate partner violence    Fear of current or ex partner: Not on file    Emotionally abused: Not on file    Physically abused: Not on file    Forced sexual activity: Not on file  Other Topics Concern  . Not on file  Social History Narrative  . Not on file     Constitutional: Denies fever, malaise, fatigue, headache or abrupt weight changes.  Gastrointestinal: Denies abdominal pain, bloating, constipation, diarrhea or blood in the stool.  GU: Pt reports vaginal discharge, itching. Denies urgency, frequency, pain with urination, burning sensation, blood in urine, odor.  No other specific complaints in a complete review of systems (except as listed in HPI above).  Objective:   Physical Exam   BP 98/60   Pulse 80   Temp 98.7 F (37.1 C) (Temporal)  Wt 117 lb (53.1 kg)   LMP 02/11/2019   SpO2 98%   BMI 20.40 kg/m  Wt Readings from Last 3 Encounters:  02/27/19 117 lb (53.1 kg)  12/24/18 116 lb (52.6 kg)  07/31/18 115 lb (52.2 kg)    General: Appears her stated age, well developed, well nourished in NAD. Abdomen: Soft and nontender. Normal bowel sounds. No distention or masses noted. Pelvic: Self swabbed. Neurological: Alert and oriented.   BMET    Component Value Date/Time   NA 138 12/24/2018 1506   K 3.6 12/24/2018 1506   CL 105 12/24/2018 1506   CO2 28 12/24/2018 1506   GLUCOSE 101 (H) 12/24/2018 1506   BUN 12 12/24/2018 1506   CREATININE 0.85 12/24/2018 1506   CALCIUM 9.0 12/24/2018 1506    Lipid Panel     Component Value Date/Time   CHOL 160 12/24/2018 1506   TRIG 47.0 12/24/2018 1506   HDL 57.70 12/24/2018 1506   CHOLHDL 3 12/24/2018  1506   VLDL 9.4 12/24/2018 1506   LDLCALC 93 12/24/2018 1506    CBC    Component Value Date/Time   WBC 5.5 12/24/2018 1506   RBC 3.67 (L) 12/24/2018 1506   HGB 12.2 12/24/2018 1506   HCT 35.6 (L) 12/24/2018 1506   PLT 217.0 12/24/2018 1506   MCV 97.2 12/24/2018 1506   MCHC 34.2 12/24/2018 1506   RDW 12.0 12/24/2018 1506    Hgb A1C No results found for: HGBA1C         Assessment & Plan:   Vaginal Discharge, Itching:  Will send off wet prep and treat per results Avoid scented soaps, Summer's Eve products, bubble baths  Will follow up after results are back Nicki Reaper, NP

## 2019-02-28 ENCOUNTER — Telehealth: Payer: Self-pay | Admitting: Internal Medicine

## 2019-02-28 ENCOUNTER — Other Ambulatory Visit: Payer: Self-pay | Admitting: Internal Medicine

## 2019-02-28 LAB — WET PREP BY MOLECULAR PROBE
Candida species: DETECTED — AB
Gardnerella vaginalis: NOT DETECTED
MICRO NUMBER:: 913799
SPECIMEN QUALITY:: ADEQUATE
Trichomonas vaginosis: NOT DETECTED

## 2019-02-28 MED ORDER — FLUCONAZOLE 150 MG PO TABS: 150.0000 mg | ORAL_TABLET | Freq: Once | ORAL | 0 refills | Status: AC

## 2019-02-28 NOTE — Telephone Encounter (Signed)
Best number [phone]  Pt called to see if her results came back from her appointment yesterday

## 2019-03-18 ENCOUNTER — Other Ambulatory Visit: Payer: Self-pay | Admitting: Internal Medicine

## 2019-04-09 ENCOUNTER — Other Ambulatory Visit (INDEPENDENT_AMBULATORY_CARE_PROVIDER_SITE_OTHER): Payer: Managed Care, Other (non HMO)

## 2019-04-09 DIAGNOSIS — E559 Vitamin D deficiency, unspecified: Secondary | ICD-10-CM

## 2019-04-10 ENCOUNTER — Other Ambulatory Visit: Payer: Self-pay | Admitting: Internal Medicine

## 2019-04-10 LAB — VITAMIN D 25 HYDROXY (VIT D DEFICIENCY, FRACTURES): VITD: 35.91 ng/mL (ref 30.00–100.00)

## 2019-05-06 ENCOUNTER — Other Ambulatory Visit: Payer: Self-pay

## 2019-05-06 DIAGNOSIS — Z20822 Contact with and (suspected) exposure to covid-19: Secondary | ICD-10-CM

## 2019-05-07 LAB — NOVEL CORONAVIRUS, NAA: SARS-CoV-2, NAA: NOT DETECTED

## 2019-05-08 ENCOUNTER — Other Ambulatory Visit: Payer: Self-pay

## 2019-05-08 DIAGNOSIS — Z20822 Contact with and (suspected) exposure to covid-19: Secondary | ICD-10-CM

## 2019-05-11 LAB — NOVEL CORONAVIRUS, NAA: SARS-CoV-2, NAA: NOT DETECTED

## 2019-05-20 ENCOUNTER — Other Ambulatory Visit: Payer: Self-pay

## 2019-05-20 DIAGNOSIS — Z20822 Contact with and (suspected) exposure to covid-19: Secondary | ICD-10-CM

## 2019-05-21 LAB — NOVEL CORONAVIRUS, NAA: SARS-CoV-2, NAA: NOT DETECTED

## 2019-06-03 ENCOUNTER — Ambulatory Visit: Payer: Managed Care, Other (non HMO)

## 2019-07-16 ENCOUNTER — Ambulatory Visit
Admission: RE | Admit: 2019-07-16 | Discharge: 2019-07-16 | Disposition: A | Discharge status: Home / Self Care | Payer: 59 | Source: Ambulatory Visit | Attending: Internal Medicine | Admitting: Internal Medicine

## 2019-07-16 ENCOUNTER — Other Ambulatory Visit: Payer: Self-pay

## 2019-07-16 DIAGNOSIS — Z1231 Encounter for screening mammogram for malignant neoplasm of breast: Secondary | ICD-10-CM

## 2019-08-03 ENCOUNTER — Ambulatory Visit: Payer: 59 | Attending: Internal Medicine

## 2019-08-03 DIAGNOSIS — Z23 Encounter for immunization: Secondary | ICD-10-CM

## 2019-08-03 NOTE — Progress Notes (Signed)
   Covid-19 Vaccination Clinic  Name:  Natividad Schlosser    MRN: [id number] DOB: 09-19-1975  08/03/2019  Ms. Gladwin was observed post Covid-19 immunization for 15 minutes without incidence. She was provided with Vaccine Information Sheet and instruction to access the V-Safe system.   Ms. Araque was instructed to call 911 with any severe reactions post vaccine: Marland Kitchen Difficulty breathing  . Swelling of your face and throat  . A fast heartbeat  . A bad rash all over your body  . Dizziness and weakness    Immunizations Administered    Name Date Dose VIS Date Route   Pfizer COVID-19 Vaccine 08/03/2019 10:25 AM 0.3 mL 05/17/2019 Intramuscular   Manufacturer: [employer], [employer]   Lot: [id number]   NDC: [address]-9

## 2019-08-14 ENCOUNTER — Other Ambulatory Visit: Payer: Self-pay

## 2019-08-14 ENCOUNTER — Ambulatory Visit (INDEPENDENT_AMBULATORY_CARE_PROVIDER_SITE_OTHER): Payer: 59 | Admitting: Family Medicine

## 2019-08-14 ENCOUNTER — Encounter: Payer: Self-pay | Admitting: Family Medicine

## 2019-08-14 VITALS — BP 116/82 | HR 80 | Temp 97.8°F | Ht 63.5 in | Wt 119.3 lb

## 2019-08-14 DIAGNOSIS — N760 Acute vaginitis: Secondary | ICD-10-CM | POA: Insufficient documentation

## 2019-08-14 DIAGNOSIS — N898 Other specified noninflammatory disorders of vagina: Secondary | ICD-10-CM

## 2019-08-14 MED ORDER — FLUCONAZOLE 150 MG PO TABS: 150.0000 mg | ORAL_TABLET | Freq: Once | ORAL | 0 refills | Status: AC

## 2019-08-14 NOTE — Patient Instructions (Addendum)
Wet prep performed today - suspicious for yeast infection. Treat wiith diflucan 150mg  tablet, may repeat in 4 days if persistent symptoms Let know if not improving with treatment.   Vaginitis Vaginitis is a condition in which the vaginal tissue swells and becomes red (inflamed). This condition is most often caused by a change in the normal balance of bacteria and yeast that live in the vagina. This change causes an overgrowth of certain bacteria or yeast, which causes the inflammation. There are different types of vaginitis, but the most common types are:  Bacterial vaginosis.  Yeast infection (candidiasis).  Trichomoniasis vaginitis. This is a sexually transmitted disease (STD).  Viral vaginitis.  Atrophic vaginitis.  Allergic vaginitis. What are the causes? The cause of this condition depends on the type of vaginitis. It can be caused by:  Bacteria (bacterial vaginosis).  Yeast, which is a fungus (yeast infection).  A parasite (trichomoniasis vaginitis).  A virus (viral vaginitis).  Low hormone levels (atrophic vaginitis). Low hormone levels can occur during pregnancy, breastfeeding, or after menopause.  Irritants, such as bubble baths, scented tampons, and feminine sprays (allergic vaginitis). Other factors can change the normal balance of the yeast and bacteria that live in the vagina. These include:  Antibiotic medicines.  Poor hygiene.  Diaphragms, vaginal sponges, spermicides, birth control pills, and intrauterine devices (IUD).  Sex.  Infection.  Uncontrolled diabetes.  A weakened defense (immune) system. What increases the risk? This condition is more likely to develop in women who:  Smoke.  Use vaginal douches, scented tampons, or scented sanitary pads.  Wear tight-fitting pants.  Wear thong underwear.  Use oral birth control pills or an IUD.  Have sex without a condom.  Have multiple sex partners.  Have an STD.  Frequently use the  spermicide nonoxynol-9.  Eat lots of foods high in sugar.  Have uncontrolled diabetes.  Have low estrogen levels.  Have a weakened immune system from an immune disorder or medical treatment.  Are pregnant or breastfeeding. What are the signs or symptoms? Symptoms vary depending on the cause of the vaginitis. Common symptoms include:  Abnormal vaginal discharge. ? The discharge is white, gray, or yellow with bacterial vaginosis. ? The discharge is thick, white, and cheesy with a yeast infection. ? The discharge is frothy and yellow or greenish with trichomoniasis.  A bad vaginal smell. The smell is fishy with bacterial vaginosis.  Vaginal itching, pain, or swelling.  Sex that is painful.  Pain or burning when urinating. Sometimes there are no symptoms. How is this diagnosed? This condition is diagnosed based on your symptoms and medical history. A physical exam, including a pelvic exam, will also be done. You may also have other tests, including:  Tests to determine the pH level (acidity or alkalinity) of your vagina.  A whiff test, to assess the odor that results when a sample of your vaginal discharge is mixed with a potassium hydroxide solution.  Tests of vaginal fluid. A sample will be examined under a microscope. How is this treated? Treatment varies depending on the type of vaginitis you have. Your treatment may include:  Antibiotic creams or pills to treat bacterial vaginosis and trichomoniasis.  Antifungal medicines, such as vaginal creams or suppositories, to treat a yeast infection.  Medicine to ease discomfort if you have viral vaginitis. Your sexual partner should also be treated.  Estrogen delivered in a cream, pill, suppository, or vaginal ring to treat atrophic vaginitis. If vaginal dryness occurs, lubricants and moisturizing creams may  help. You may need to avoid scented soaps, sprays, or douches.  Stopping use of a product that is causing allergic  vaginitis. Then using a vaginal cream to treat the symptoms. Follow these instructions at home: Lifestyle  Keep your genital area clean and dry. Avoid soap, and only rinse the area with water.  Do not douche or use tampons until your health care provider says it is okay to do so. Use sanitary pads, if needed.  Do not have sex until your health care provider approves. When you can return to sex, practice safe sex and use condoms.  Wipe from front to back. This avoids the spread of bacteria from the rectum to the vagina. General instructions  Take over-the-counter and prescription medicines only as told by your health care provider.  If you were prescribed an antibiotic medicine, take or use it as told by your health care provider. Do not stop taking or using the antibiotic even if you start to feel better.  Keep all follow-up visits as told by your health care provider. This is important. How is this prevented?  Use mild, non-scented products. Do not use things that can irritate the vagina, such as fabric softeners. Avoid the following products if they are scented: ? Feminine sprays. ? Detergents. ? Tampons. ? Feminine hygiene products. ? Soaps or bubble baths.  Let air reach your genital area. ? Wear cotton underwear to reduce moisture buildup. ? Avoid wearing underwear while you sleep. ? Avoid wearing tight pants and underwear or nylons without a cotton panel. ? Avoid wearing thong underwear.  Take off any wet clothing, such as bathing suits, as soon as possible.  Practice safe sex and use condoms. Contact a health care provider if:  You have abdominal pain.  You have a fever.  You have symptoms that last for more than 2-3 days. Get help right away if:  You have a fever and your symptoms suddenly get worse. Summary  Vaginitis is a condition in which the vaginal tissue becomes inflamed.This condition is most often caused by a change in the normal balance of bacteria  and yeast that live in the vagina.  Treatment varies depending on the type of vaginitis you have.  Do not douche, use tampons , or have sex until your health care provider approves. When you can return to sex, practice safe sex and use condoms. This information is not intended to replace advice given to you by your health care provider. Make sure you discuss any questions you have with your health care provider. Document Revised: 05/05/2017 Document Reviewed: 06/28/2016 Elsevier Patient Education  2020 [employer].

## 2019-08-14 NOTE — Assessment & Plan Note (Addendum)
Symptoms started after latex exposure. No significant redness or swelling noted - but more suspicion for recurrent yeast vaginitis. Wet prep sent. Will send diflucan 150mg  x2. Update if ongoing symptoms.  Patient aware to avoid latex exposure in the future.

## 2019-08-14 NOTE — Progress Notes (Signed)
This visit was conducted in person.  BP 116/82 (BP Location: Left Arm, Patient Position: Sitting, Cuff Size: Normal)   Pulse 80   Temp 97.8 F (36.6 C) (Temporal)   Ht 5' 3.5" (1.613 m)   Wt 119 lb 5 oz (54.1 kg)   LMP 07/29/2019   SpO2 97%   BMI 20.80 kg/m    CC: ?yeast infection Subjective:    Patient ID: De Nurse, female    DOB: [birth year], 44 y.o.   MRN: [id number]  HPI: Connie [name] is a 44 y.o. female presenting on 08/14/2019 for Vaginal Itching (C/o vaginal itching.  Started on 08/09/19 after using latex condom during intercourse.  H/o latex allergy. )   5d h/o vaginal itching after using latex condom during sex - h/o latex allergy.  She has been treating with vaseline.   Denies rash, or swelling. No abd pain, urinary symptoms. Mild discharge.      Relevant past medical, surgical, family and social history reviewed and updated as indicated. Interim medical history since our last visit reviewed. Allergies and medications reviewed and updated. Outpatient Medications Prior to Visit  Medication Sig Dispense Refill  . Ascorbic Acid (VITAMIN C PO) Take by mouth daily.    . Cholecalciferol (VITAMIN D3 PO) Take by mouth daily.    . Multiple Vitamin (MULTIVITAMIN ADULT PO) Take by mouth daily.    . Vitamin D, Ergocalciferol, (DRISDOL) 1.25 MG (50000 UT) CAPS capsule Take 1 capsule (50,000 Units total) by mouth every 7 (seven) days. (Patient not taking: Reported on 08/14/2019) 12 capsule 0  . Ascorbic Acid (VITAMIN C) 1000 MG tablet Take 1,000 mg by mouth daily.     No facility-administered medications prior to visit.     Per HPI unless specifically indicated in ROS section below Review of Systems Objective:    BP 116/82 (BP Location: Left Arm, Patient Position: Sitting, Cuff Size: Normal)   Pulse 80   Temp 97.8 F (36.6 C) (Temporal)   Ht 5' 3.5" (1.613 m)   Wt 119 lb 5 oz (54.1 kg)   LMP 07/29/2019   SpO2 97%   BMI 20.80 kg/m   Wt Readings from Last 3  Encounters:  08/14/19 119 lb 5 oz (54.1 kg)  02/27/19 117 lb (53.1 kg)  12/24/18 116 lb (52.6 kg)    Physical Exam Vitals and nursing note reviewed. Exam conducted with a chaperone present.  Constitutional:      Appearance: Normal appearance. She is not ill-appearing.  Genitourinary:    Exam position: Supine.     Pubic Area: No rash.      Labia:        Right: No rash or tenderness.        Left: No rash or tenderness.      Vagina: Vaginal discharge (thick white) present. No tenderness.  Neurological:     Mental Status: She is alert.       Assessment & Plan:  This visit occurred during the SARS-CoV-2 public health emergency.  Safety protocols were in place, including screening questions prior to the visit, additional usage of staff PPE, and extensive cleaning of exam room while observing appropriate contact time as indicated for disinfecting solutions.   Problem List Items Addressed This Visit    Vaginitis - Primary    Symptoms started after latex exposure. No significant redness or swelling noted - but more suspicion for recurrent yeast vaginitis. Wet prep sent. Will send diflucan 150mg  x2. Update if ongoing symptoms.  Patient aware  to avoid latex exposure in the future.       Relevant Orders   WET PREP BY MOLECULAR PROBE       Meds ordered this encounter  Medications  . fluconazole (DIFLUCAN) 150 MG tablet    Sig: Take 1 tablet (150 mg total) by mouth once for 1 dose. Repeat in 4 days if needed    Dispense:  2 tablet    Refill:  0   Orders Placed This Encounter  Procedures  . WET PREP BY MOLECULAR PROBE    Patient instructions: Wet prep performed today - suspicious for yeast infection. Treat wiith diflucan 150mg  tablet, may repeat in 4 days if persistent symptoms Let us know if not improving with treatment.   Follow up plan: No follow-ups on file.  Ria Bush, MD

## 2019-08-15 ENCOUNTER — Other Ambulatory Visit: Payer: Self-pay | Admitting: Family Medicine

## 2019-08-15 LAB — WET PREP BY MOLECULAR PROBE
Candida species: NOT DETECTED
MICRO NUMBER:: 10236086
SPECIMEN QUALITY:: ADEQUATE
Trichomonas vaginosis: NOT DETECTED

## 2019-08-15 MED ORDER — METRONIDAZOLE 0.75 % VA GEL: 1.0000 | Freq: Two times a day (BID) | VAGINAL | 0 refills | Status: DC

## 2019-08-22 ENCOUNTER — Ambulatory Visit (INDEPENDENT_AMBULATORY_CARE_PROVIDER_SITE_OTHER): Payer: 59 | Admitting: Internal Medicine

## 2019-08-22 ENCOUNTER — Encounter: Payer: Self-pay | Admitting: Internal Medicine

## 2019-08-22 ENCOUNTER — Other Ambulatory Visit: Payer: Self-pay

## 2019-08-22 VITALS — BP 108/68 | HR 63 | Temp 98.0°F | Wt 118.0 lb

## 2019-08-22 DIAGNOSIS — N898 Other specified noninflammatory disorders of vagina: Secondary | ICD-10-CM | POA: Diagnosis not present

## 2019-08-22 MED ORDER — METRONIDAZOLE 0.75 % VA GEL: 1.0000 | Freq: Two times a day (BID) | VAGINAL | 0 refills | Status: DC

## 2019-08-22 NOTE — Patient Instructions (Signed)

## 2019-08-22 NOTE — Addendum Note (Signed)
Addended by: Roena Malady on: 08/22/2019 03:32 PM   Modules accepted: Orders

## 2019-08-22 NOTE — Progress Notes (Signed)
Subjective:    Patient ID: Connie [name], female    DOB: 10/14/1975, 44 y.o.   MRN: [id number]  HPI  Pt presents to the clinic today with c/o vaginal discharge and itching. She reports the discharge is thin, creamy. She denies any pelvic pain,odor or abnormal bleeding. She was treated for BV 3/10 with Diflucan and Metrogel. She reports she started her menses right when she started the Metrogel, so she is not sure if it is effective.  Review of Systems      No past medical history on file.  Current Outpatient Medications  Medication Sig Dispense Refill  . Ascorbic Acid (VITAMIN C PO) Take by mouth daily.    . Cholecalciferol (VITAMIN D3 PO) Take by mouth daily.    . metroNIDAZOLE (METROGEL VAGINAL) 0.75 % vaginal gel Place 1 Applicatorful vaginally 2 (two) times daily. For 5 days 70 g 0  . Multiple Vitamin (MULTIVITAMIN ADULT PO) Take by mouth daily.    . Vitamin D, Ergocalciferol, (DRISDOL) 1.25 MG (50000 UT) CAPS capsule Take 1 capsule (50,000 Units total) by mouth every 7 (seven) days. (Patient not taking: Reported on 08/14/2019) 12 capsule 0   No current facility-administered medications for this visit.    Allergies  Allergen Reactions  . Latex Rash    Also causes itching    Family History  Problem Relation Age of Onset  . Breast cancer Maternal Grandmother   . Thyroid cancer Mother   . Hypertension Father     Social History   Socioeconomic History  . Marital status: Single    Spouse name: Not on file  . Number of children: Not on file  . Years of education: Not on file  . Highest education level: Not on file  Occupational History  . Not on file  Tobacco Use  . Smoking status: Never Smoker  . Smokeless tobacco: Never Used  Substance and Sexual Activity  . Alcohol use: Yes    Comment: occasional  . Drug use: Never  . Sexual activity: Not on file  Other Topics Concern  . Not on file  Social History Narrative  . Not on file   Social Determinants of Health    Financial Resource Strain:   . Difficulty of Paying Living Expenses:   Food Insecurity:   . Worried About Charity fundraiser in the Last Year:   . Arboriculturist in the Last Year:   Transportation Needs:   . Film/video editor (Medical):   Marland Kitchen Lack of Transportation (Non-Medical):   Physical Activity:   . Days of Exercise per Week:   . Minutes of Exercise per Session:   Stress:   . Feeling of Stress :   Social Connections:   . Frequency of Communication with Friends and Family:   . Frequency of Social Gatherings with Friends and Family:   . Attends Religious Services:   . Active Member of Clubs or Organizations:   . Attends Archivist Meetings:   Marland Kitchen Marital Status:   Intimate Partner Violence:   . Fear of Current or Ex-Partner:   . Emotionally Abused:   Marland Kitchen Physically Abused:   . Sexually Abused:      Constitutional: Denies fever, malaise, fatigue, headache or abrupt weight changes.  Respiratory: Denies difficulty breathing, shortness of breath, cough or sputum production.   Cardiovascular: Denies chest pain, chest tightness, palpitations or swelling in the hands or feet.  Gastrointestinal: Denies abdominal pain, bloating, constipation, diarrhea or blood in  the stool.  GU: Pt reports vaginal discharge and itching. Denies urgency, frequency, pain with urination, burning sensation, blood in urine, odor or discharge.  No other specific complaints in a complete review of systems (except as listed in HPI above).  Objective:   Physical Exam  BP 108/68   Pulse 63   Temp 98 F (36.7 C) (Temporal)   Wt 118 lb (53.5 kg)   LMP 08/18/2019   SpO2 98%   BMI 20.57 kg/m   Wt Readings from Last 3 Encounters:  08/14/19 119 lb 5 oz (54.1 kg)  02/27/19 117 lb (53.1 kg)  12/24/18 116 lb (52.6 kg)    General: Appears her stated age, well developed, well nourished in NAD. Cardiovascular: Normal rate and rhythm.  Pulmonary/Chest: Normal effort and positive vesicular  breath sounds. No respiratory distress. No wheezes, rales or ronchi noted.  Pelvic: self swabbed. Neurological: Alert and oriented.   BMET    Component Value Date/Time   NA 138 12/24/2018 1506   K 3.6 12/24/2018 1506   CL 105 12/24/2018 1506   CO2 28 12/24/2018 1506   GLUCOSE 101 (H) 12/24/2018 1506   BUN 12 12/24/2018 1506   CREATININE 0.85 12/24/2018 1506   CALCIUM 9.0 12/24/2018 1506    Lipid Panel     Component Value Date/Time   CHOL 160 12/24/2018 1506   TRIG 47.0 12/24/2018 1506   HDL 57.70 12/24/2018 1506   CHOLHDL 3 12/24/2018 1506   VLDL 9.4 12/24/2018 1506   LDLCALC 93 12/24/2018 1506    CBC    Component Value Date/Time   WBC 5.5 12/24/2018 1506   RBC 3.67 (L) 12/24/2018 1506   HGB 12.2 12/24/2018 1506   HCT 35.6 (L) 12/24/2018 1506   PLT 217.0 12/24/2018 1506   MCV 97.2 12/24/2018 1506   MCHC 34.2 12/24/2018 1506   RDW 12.0 12/24/2018 1506    Hgb A1C No results found for: HGBA1C         Assessment & Plan:   Vaginal Discharge, Itching:  Will send off wet prep Metrogel refilled today  Return precautions discussed Nicki Reaper, NP This visit occurred during the SARS-CoV-2 public health emergency.  Safety protocols were in place, including screening questions prior to the visit, additional usage of staff PPE, and extensive cleaning of exam room while observing appropriate contact time as indicated for disinfecting solutions.

## 2019-08-23 LAB — WET PREP BY MOLECULAR PROBE
Candida species: NOT DETECTED
MICRO NUMBER:: 10266411
SPECIMEN QUALITY:: ADEQUATE
Trichomonas vaginosis: NOT DETECTED

## 2019-08-24 ENCOUNTER — Ambulatory Visit: Payer: 59 | Attending: Internal Medicine

## 2019-08-24 DIAGNOSIS — Z23 Encounter for immunization: Secondary | ICD-10-CM

## 2019-08-24 NOTE — Progress Notes (Signed)
   Covid-19 Vaccination Clinic  Name:  Connie [name]    MRN: [id number] DOB: 01/21/1976  08/24/2019  [name] was observed post Covid-19 immunization for 15 minutes without incident. She was provided with Vaccine Information Sheet and instruction to access the V-Safe system.   [name] was instructed to call 911 with any severe reactions post vaccine: Marland Kitchen Difficulty breathing  . Swelling of face and throat  . A fast heartbeat  . A bad rash all over body  . Dizziness and weakness   Immunizations Administered    Name Date Dose VIS Date Route   Pfizer COVID-19 Vaccine 08/24/2019 11:50 AM 0.3 mL 05/17/2019 Intramuscular   Manufacturer: [employer], [employer]   Lot: [id number]   NDC: [address]-4

## 2019-08-28 ENCOUNTER — Other Ambulatory Visit: Payer: Self-pay

## 2019-08-28 ENCOUNTER — Ambulatory Visit (INDEPENDENT_AMBULATORY_CARE_PROVIDER_SITE_OTHER): Payer: 59 | Admitting: Internal Medicine

## 2019-08-28 ENCOUNTER — Ambulatory Visit: Payer: 59

## 2019-08-28 ENCOUNTER — Encounter: Payer: Self-pay | Admitting: Internal Medicine

## 2019-08-28 VITALS — BP 108/70 | HR 72 | Temp 97.9°F | Wt 117.0 lb

## 2019-08-28 DIAGNOSIS — Z124 Encounter for screening for malignant neoplasm of cervix: Secondary | ICD-10-CM

## 2019-08-28 DIAGNOSIS — Z113 Encounter for screening for infections with a predominantly sexual mode of transmission: Secondary | ICD-10-CM | POA: Diagnosis not present

## 2019-08-28 DIAGNOSIS — N760 Acute vaginitis: Secondary | ICD-10-CM

## 2019-08-28 DIAGNOSIS — N898 Other specified noninflammatory disorders of vagina: Secondary | ICD-10-CM | POA: Diagnosis not present

## 2019-08-28 DIAGNOSIS — B9689 Other specified bacterial agents as the cause of diseases classified elsewhere: Secondary | ICD-10-CM

## 2019-08-28 MED ORDER — FLUCONAZOLE 100 MG PO TABS: 100.0000 mg | ORAL_TABLET | Freq: Every day | ORAL | 0 refills | Status: DC

## 2019-08-28 NOTE — Progress Notes (Signed)
Subjective:    Patient ID: Connie [name], female    DOB: 23-Jul-1975, 44 y.o.   MRN: [id number]  HPI  Pt presents to the clinic today to discuss recent diagnosis of BV. This was diagnosed 3/10. She was prescribed Difulcan and Metrogel. She thinks the Metrogel did not work as she was on her menses. She was retested 3/18, still positive for BV. The Metrogel treatment was repeated. She reports continued thin white vaginal discharge with itching. She denies pelvic pain. She denies urinary urgency, frequency, dysuria or blood in her urine. She reports vaginal odor, pain with intercourse or abnormal vaginal bleeding. She recently became sexually active within the last month. Her last pap smear was in 2017.  Review of Systems      No past medical history on file.  Current Outpatient Medications  Medication Sig Dispense Refill  . Ascorbic Acid (VITAMIN C PO) Take by mouth daily.    . Cholecalciferol (VITAMIN D3 PO) Take by mouth daily.    . metroNIDAZOLE (METROGEL VAGINAL) 0.75 % vaginal gel Place 1 Applicatorful vaginally 2 (two) times daily. For 5 days 70 g 0  . Multiple Vitamin (MULTIVITAMIN ADULT PO) Take by mouth daily.    . Vitamin D, Ergocalciferol, (DRISDOL) 1.25 MG (50000 UT) CAPS capsule Take 1 capsule (50,000 Units total) by mouth every 7 (seven) days. 12 capsule 0   No current facility-administered medications for this visit.    Allergies  Allergen Reactions  . Latex Rash    Also causes itching    Family History  Problem Relation Age of Onset  . Breast cancer Maternal Grandmother   . Thyroid cancer Mother   . Hypertension Father     Social History   Socioeconomic History  . Marital status: Single    Spouse name: Not on file  . Number of children: Not on file  . Years of education: Not on file  . Highest education level: Not on file  Occupational History  . Not on file  Tobacco Use  . Smoking status: Never Smoker  . Smokeless tobacco: Never Used  Substance and  Sexual Activity  . Alcohol use: Yes    Comment: occasional  . Drug use: Never  . Sexual activity: Not on file  Other Topics Concern  . Not on file  Social History Narrative  . Not on file   Social Determinants of Health   Financial Resource Strain:   . Difficulty of Paying Living Expenses:   Food Insecurity:   . Worried About Programme researcher, broadcasting/film/video in the Last Year:   . Barista in the Last Year:   Transportation Needs:   . Freight forwarder (Medical):   Marland Kitchen Lack of Transportation (Non-Medical):   Physical Activity:   . Days of Exercise per Week:   . Minutes of Exercise per Session:   Stress:   . Feeling of Stress :   Social Connections:   . Frequency of Communication with Friends and Family:   . Frequency of Social Gatherings with Friends and Family:   . Attends Religious Services:   . Active Member of Clubs or Organizations:   . Attends Banker Meetings:   Marland Kitchen Marital Status:   Intimate Partner Violence:   . Fear of Current or Ex-Partner:   . Emotionally Abused:   Marland Kitchen Physically Abused:   . Sexually Abused:      Constitutional: Denies fever, malaise, fatigue, headache or abrupt weight changes.  Respiratory: Denies difficulty  breathing, shortness of breath, cough or sputum production.   Cardiovascular: Denies chest pain, chest tightness, palpitations or swelling in the hands or feet.  Gastrointestinal: Denies abdominal pain, bloating, constipation, diarrhea or blood in the stool.  GU: Pt reports vaginal discharge and itching. Denies urgency, frequency, pain with urination, burning sensation, blood in urine, odor.  No other specific complaints in a complete review of systems (except as listed in HPI above).  Objective:   Physical Exam  BP 108/70   Pulse 72   Temp 97.9 F (36.6 C) (Temporal)   Wt 117 lb (53.1 kg)   LMP 08/18/2019   SpO2 98%   BMI 20.40 kg/m   Wt Readings from Last 3 Encounters:  08/22/19 118 lb (53.5 kg)  08/14/19 119 lb 5  oz (54.1 kg)  02/27/19 117 lb (53.1 kg)    General: Appears her stated age, well developed, well nourished in NAD. Cardiovascular: Normal rate and rhythm.  Pulmonary/Chest: Normal effort. Abdomen: Soft and nontender.  Pelvic: Normal female anatomy. Cervix not visualized. Large amount of thick white chunky discharge noted, no odor. No CMT noted. Adnexa non palpable.  Neurological: Alert and oriented.   BMET    Component Value Date/Time   NA 138 12/24/2018 1506   K 3.6 12/24/2018 1506   CL 105 12/24/2018 1506   CO2 28 12/24/2018 1506   GLUCOSE 101 (H) 12/24/2018 1506   BUN 12 12/24/2018 1506   CREATININE 0.85 12/24/2018 1506   CALCIUM 9.0 12/24/2018 1506    Lipid Panel     Component Value Date/Time   CHOL 160 12/24/2018 1506   TRIG 47.0 12/24/2018 1506   HDL 57.70 12/24/2018 1506   CHOLHDL 3 12/24/2018 1506   VLDL 9.4 12/24/2018 1506   LDLCALC 93 12/24/2018 1506    CBC    Component Value Date/Time   WBC 5.5 12/24/2018 1506   RBC 3.67 (L) 12/24/2018 1506   HGB 12.2 12/24/2018 1506   HCT 35.6 (L) 12/24/2018 1506   PLT 217.0 12/24/2018 1506   MCV 97.2 12/24/2018 1506   MCHC 34.2 12/24/2018 1506   RDW 12.0 12/24/2018 1506    Hgb A1C No results found for: HGBA1C          Assessment & Plan:   BV, Vaginal Discharge, Vaginal Itching, Screen for Cervical Cancer:  Pap smear today- with STD screening Avoid bubble baths, douching, washing with scented soaps or using feminine wash products RX for Diflucan 100 mg daily x 7 days  Will follow up after labs, return precautions discussed  Webb Silversmith, NP. This visit occurred during the SARS-CoV-2 public health emergency.  Safety protocols were in place, including screening questions prior to the visit, additional usage of staff PPE, and extensive cleaning of exam room while observing appropriate contact time as indicated for disinfecting solutions.

## 2019-08-28 NOTE — Patient Instructions (Signed)

## 2019-08-29 ENCOUNTER — Other Ambulatory Visit (HOSPITAL_COMMUNITY)
Admission: RE | Admit: 2019-08-29 | Discharge: 2019-08-29 | Disposition: A | Discharge status: Home / Self Care | Payer: 59 | Source: Ambulatory Visit | Attending: Internal Medicine | Admitting: Internal Medicine

## 2019-08-29 DIAGNOSIS — Z124 Encounter for screening for malignant neoplasm of cervix: Secondary | ICD-10-CM | POA: Diagnosis present

## 2019-08-29 DIAGNOSIS — Z113 Encounter for screening for infections with a predominantly sexual mode of transmission: Secondary | ICD-10-CM | POA: Diagnosis present

## 2019-08-29 NOTE — Addendum Note (Signed)
Addended by: Roena Malady on: 08/29/2019 02:32 PM   Modules accepted: Orders

## 2019-09-03 ENCOUNTER — Ambulatory Visit: Payer: 59 | Admitting: Internal Medicine

## 2019-09-03 LAB — CYTOLOGY - PAP
Adequacy: ABSENT
Chlamydia: NEGATIVE
Comment: NEGATIVE
Comment: NEGATIVE
Comment: NEGATIVE
Comment: NORMAL
Diagnosis: NEGATIVE
High risk HPV: NEGATIVE
Neisseria Gonorrhea: NEGATIVE
Trichomonas: NEGATIVE

## 2019-09-03 NOTE — Addendum Note (Signed)
Addended by: Lorre Munroe on: 09/03/2019 01:58 PM   Modules accepted: Orders

## 2019-09-04 ENCOUNTER — Telehealth: Payer: Self-pay

## 2019-09-04 NOTE — Telephone Encounter (Signed)
Agree with advice given

## 2019-09-04 NOTE — Telephone Encounter (Signed)
Pt was seen 08/28/19; pt finished the diflucan 100 mg daily for 7 days on 09/02/19. Pt said the white vaginal discharge is not as thick as when seen on 08/28/19 but still has white, thin vaginal discharge with itching. Pt has no fever and no abd pain. Pt said she initially cancelled the GYN referral on 09/12/19 due to pt being on menstrual period at that time; pt was rescheduled on 09/05/19. Pt received call from Herbert Seta due to double booking physician and the 09/05/19 appt was cancelled. Pt was still having symptoms and Heather at GYN office said she offered pt another date for referral to GYN but pt wanted to wait. Pt said there was a misunderstanding and pt will cb to GYN on [facility] [phone] to schedule appt. FYI to Pamala Hurry NP.

## 2019-09-05 ENCOUNTER — Encounter: Payer: 59 | Admitting: Obstetrics & Gynecology

## 2019-09-12 ENCOUNTER — Encounter: Payer: 59 | Admitting: Obstetrics & Gynecology

## 2019-09-19 ENCOUNTER — Other Ambulatory Visit: Payer: Self-pay

## 2019-09-19 ENCOUNTER — Encounter: Payer: Self-pay | Admitting: Obstetrics & Gynecology

## 2019-09-19 ENCOUNTER — Other Ambulatory Visit (HOSPITAL_COMMUNITY)
Admission: RE | Admit: 2019-09-19 | Discharge: 2019-09-19 | Disposition: A | Discharge status: Home / Self Care | Payer: 59 | Source: Ambulatory Visit | Attending: Obstetrics & Gynecology | Admitting: Obstetrics & Gynecology

## 2019-09-19 ENCOUNTER — Encounter: Payer: Self-pay | Admitting: Radiology

## 2019-09-19 ENCOUNTER — Ambulatory Visit (INDEPENDENT_AMBULATORY_CARE_PROVIDER_SITE_OTHER): Payer: 59 | Admitting: Obstetrics & Gynecology

## 2019-09-19 VITALS — BP 119/70 | HR 67 | Wt 119.0 lb

## 2019-09-19 DIAGNOSIS — N760 Acute vaginitis: Secondary | ICD-10-CM | POA: Diagnosis not present

## 2019-09-19 NOTE — Progress Notes (Deleted)
Pt states she has reoccurring vaginal yeast

## 2019-09-19 NOTE — Progress Notes (Signed)
GYNECOLOGY OFFICE VISIT NOTE  History:   Connie [name] is a 44 y.o. F here today for evaluation and management of recurrent vaginitis.  Had several episodes of yeast and BV (two BV episodes in 08/2019) and has taken multiple varying regimens of Diflucan and Metronidazole. Also gets irritated after sexual intercourse when latex condoms are used.  Practicing proper vulvovaginal hygiene but still has these episodes, Currently she has some yellow discharge with vulvar irritation for a few days.   She denies any abnormal vaginal bleeding, pelvic pain or other concerns.    History reviewed. No pertinent past medical history.  Past Surgical History:  Procedure Laterality Date  . CESAREAN SECTION      The following portions of the patient's history were reviewed and updated as appropriate: allergies, current medications, past family history, past medical history, past social history, past surgical history and problem list.   Health Maintenance:  Normal pap and negative HRHPV on 08/29/2019.  Normal mammogram on 07/16/2019.  Review of Systems:  Pertinent items noted in HPI and remainder of comprehensive ROS otherwise negative.  Physical Exam:  BP 119/70   Pulse 67   Wt 119 lb (54 kg)   LMP 09/13/2019 (Exact Date)   BMI 20.75 kg/m  CONSTITUTIONAL: Well-developed, well-nourished female in no acute distress.  HEENT:  Normocephalic, atraumatic. External right and left ear normal. No scleral icterus.  NECK: Normal range of motion, supple, no masses noted on observation SKIN: No rash noted. Not diaphoretic. No erythema. No pallor. MUSCULOSKELETAL: Normal range of motion. No edema noted. NEUROLOGIC: Alert and oriented to person, place, and time. Normal muscle tone coordination. No cranial nerve deficit noted. PSYCHIATRIC: Normal mood and affect. Normal behavior. Normal judgment and thought content. CARDIOVASCULAR: Normal heart rate noted RESPIRATORY: Effort and breath sounds normal, no problems with  respiration noted ABDOMEN: No masses noted. No other overt distention noted.   PELVIC: Normal appearing external genitalia; normal urethral meatus; normal appearing vaginal mucosa and cervix.  Scant thick yellow discharge noted, testing sample obtained.   Performed in the presence of a chaperone     Assessment and Plan:      1. Recurrent vaginitis Re-emphasized proper vulvar hygiene: discussed avoidance of perfumed soaps, detergents, lotions and any type of douches; in addition to wearing cotton underwear and no underwear at night.  Also recommended cleaning front to back, voiding and cleaning up after intercourse.  Emphasized avoidance of latex condoms, advised her to buy her own non-latex prophylactics are insist her partners use them.  Will follow up test results and manage accordingly.   - Cervicovaginal ancillary only( [place]) If the results show recurrent bacterial vaginosis, will prescribe Metronidazole 500 mg po daily x 7 days, followed by 0.75% Metronidazole vaginal gel nightly x 10 days then twice a week for 6 months. This is the recommended therapy for suppression of bacterial vaginosis. Prolonged oral therapy is not recommended because of concerns about toxicity.  Alternative therapy is: oral metronidazole 500 mg twice daily or oral tinidazole 1 gram once daily for seven days; vaginal boric acid 600 mg is begun at the same time and continued for 21 days.  Patients are seen for follow-up a day or two after their last vaginal boric acid dose; if they are in remission, will begin metronidazole gel twice weekly for six months as suppressive therapy.  A potential side effect of the prolonged therapy is vaginal candidiasis (yeast infection) so Diflucan 150 mg will be prescribed.  Routine preventative health  maintenance measures emphasized. Please refer to After Visit Summary for other counseling recommendations.   Return for any gynecologic concerns.    Total face-to-face time with  patient: 20 minutes.  Over 50% of encounter was spent on counseling and coordination of care.   Verita Schneiders, MD, [place] for [employer], [place]

## 2019-09-19 NOTE — Patient Instructions (Signed)
Vaginitis Vaginitis is a condition in which the vaginal tissue swells and becomes red (inflamed). This condition is most often caused by a change in the normal balance of bacteria and yeast that live in the vagina. This change causes an overgrowth of certain bacteria or yeast, which causes the inflammation. There are different types of vaginitis, but the most common types are:  Bacterial vaginosis.  Yeast infection (candidiasis).  Trichomoniasis vaginitis. This is a sexually transmitted disease (STD).  Viral vaginitis.  Atrophic vaginitis.  Allergic vaginitis. What are the causes? The cause of this condition depends on the type of vaginitis. It can be caused by:  Bacteria (bacterial vaginosis).  Yeast, which is a fungus (yeast infection).  A parasite (trichomoniasis vaginitis).  A virus (viral vaginitis).  Low hormone levels (atrophic vaginitis). Low hormone levels can occur during pregnancy, breastfeeding, or after menopause.  Irritants, such as bubble baths, scented tampons, and feminine sprays (allergic vaginitis). Other factors can change the normal balance of the yeast and bacteria that live in the vagina. These include:  Antibiotic medicines.  Poor hygiene.  Diaphragms, vaginal sponges, spermicides, birth control pills, and intrauterine devices (IUD).  Sex.  Infection.  Uncontrolled diabetes.  A weakened defense (immune) system. What increases the risk? This condition is more likely to develop in women who:  Smoke.  Use vaginal douches, scented tampons, or scented sanitary pads.  Wear tight-fitting pants.  Wear thong underwear.  Use oral birth control pills or an IUD.  Have sex without a condom.  Have multiple sex partners.  Have an STD.  Frequently use the spermicide nonoxynol-9.  Eat lots of foods high in sugar.  Have uncontrolled diabetes.  Have low estrogen levels.  Have a weakened immune system from an immune disorder or medical  treatment.  Are pregnant or breastfeeding. What are the signs or symptoms? Symptoms vary depending on the cause of the vaginitis. Common symptoms include:  Abnormal vaginal discharge. ? The discharge is white, gray, or yellow with bacterial vaginosis. ? The discharge is thick, white, and cheesy with a yeast infection. ? The discharge is frothy and yellow or greenish with trichomoniasis.  A bad vaginal smell. The smell is fishy with bacterial vaginosis.  Vaginal itching, pain, or swelling.  Sex that is painful.  Pain or burning when urinating. Sometimes there are no symptoms. How is this diagnosed? This condition is diagnosed based on your symptoms and medical history. A physical exam, including a pelvic exam, will also be done. You may also have other tests, including:  Tests to determine the pH level (acidity or alkalinity) of your vagina.  A whiff test, to assess the odor that results when a sample of your vaginal discharge is mixed with a potassium hydroxide solution.  Tests of vaginal fluid. A sample will be examined under a microscope. How is this treated? Treatment varies depending on the type of vaginitis you have. Your treatment may include:  Antibiotic creams or pills to treat bacterial vaginosis and trichomoniasis.  Antifungal medicines, such as vaginal creams or suppositories, to treat a yeast infection.  Medicine to ease discomfort if you have viral vaginitis. Your sexual partner should also be treated.  Estrogen delivered in a cream, pill, suppository, or vaginal ring to treat atrophic vaginitis. If vaginal dryness occurs, lubricants and moisturizing creams may help. You may need to avoid scented soaps, sprays, or douches.  Stopping use of a product that is causing allergic vaginitis. Then using a vaginal cream to treat the symptoms. Follow   these instructions at home: Lifestyle  Keep your genital area clean and dry. Avoid soap, and only rinse the area with  water.  Do not douche or use tampons until your health care provider says it is okay to do so. Use sanitary pads, if needed.  Do not have sex until your health care provider approves. When you can return to sex, practice safe sex and use condoms.  Wipe from front to back. This avoids the spread of bacteria from the rectum to the vagina. General instructions  Take over-the-counter and prescription medicines only as told by your health care provider.  If you were prescribed an antibiotic medicine, take or use it as told by your health care provider. Do not stop taking or using the antibiotic even if you start to feel better.  Keep all follow-up visits as told by your health care provider. This is important. How is this prevented?  Use mild, non-scented products. Do not use things that can irritate the vagina, such as fabric softeners. Avoid the following products if they are scented: ? Feminine sprays. ? Detergents. ? Tampons. ? Feminine hygiene products. ? Soaps or bubble baths.  Let air reach your genital area. ? Wear cotton underwear to reduce moisture buildup. ? Avoid wearing underwear while you sleep. ? Avoid wearing tight pants and underwear or nylons without a cotton panel. ? Avoid wearing thong underwear.  Take off any wet clothing, such as bathing suits, as soon as possible.  Practice safe sex and use condoms. Contact a health care provider if:  You have abdominal pain.  You have a fever.  You have symptoms that last for more than 2-3 days. Get help right away if:  You have a fever and your symptoms suddenly get worse. Summary  Vaginitis is a condition in which the vaginal tissue becomes inflamed.This condition is most often caused by a change in the normal balance of bacteria and yeast that live in the vagina.  Treatment varies depending on the type of vaginitis you have.  Do not douche, use tampons , or have sex until your health care provider approves. When  you can return to sex, practice safe sex and use condoms. This information is not intended to replace advice given to you by your health care provider. Make sure you discuss any questions you have with your health care provider. Document Revised: 05/05/2017 Document Reviewed: 06/28/2016 Elsevier Patient Education  2020 [employer] Inc[employer]  

## 2019-09-20 LAB — CERVICOVAGINAL ANCILLARY ONLY
Bacterial Vaginitis (gardnerella): NEGATIVE
Candida Glabrata: NEGATIVE
Candida Vaginitis: NEGATIVE
Chlamydia: NEGATIVE
Comment: NEGATIVE
Comment: NEGATIVE
Comment: NEGATIVE
Comment: NEGATIVE
Comment: NEGATIVE
Comment: NORMAL
Neisseria Gonorrhea: NEGATIVE
Trichomonas: NEGATIVE

## 2019-11-12 ENCOUNTER — Ambulatory Visit: Payer: 59 | Admitting: Dermatology

## 2019-12-18 ENCOUNTER — Ambulatory Visit (INDEPENDENT_AMBULATORY_CARE_PROVIDER_SITE_OTHER): Payer: 59 | Admitting: Dermatology

## 2019-12-18 ENCOUNTER — Other Ambulatory Visit: Payer: Self-pay

## 2019-12-18 DIAGNOSIS — L668 Other cicatricial alopecia: Secondary | ICD-10-CM

## 2019-12-18 DIAGNOSIS — L669 Cicatricial alopecia, unspecified: Secondary | ICD-10-CM

## 2019-12-18 DIAGNOSIS — L219 Seborrheic dermatitis, unspecified: Secondary | ICD-10-CM

## 2019-12-18 MED ORDER — KETOCONAZOLE 2 % EX SHAM: 1.0000 "application " | MEDICATED_SHAMPOO | CUTANEOUS | 5 refills | Status: DC

## 2019-12-18 MED ORDER — FLUOCINOLONE ACETONIDE BODY 0.01 % EX OIL: TOPICAL_OIL | CUTANEOUS | 5 refills | Status: DC

## 2019-12-18 NOTE — Progress Notes (Signed)
   Follow-Up Visit   Subjective  Connie [name] is a 44 y.o. female who presents for the following: Alopecia (6 months f/u [facility], treating with Rogaine solution with a good response) and Dermatitis (6 months f/u [facility] derm on her scalp, treating with Dermasmooth fs oil, Ketoconazole shampoo ). Needs rfs.  Scalp not as itchy.  The following portions of the chart were reviewed this encounter and updated as appropriate:      Review of Systems:  No other skin or systemic complaints except as noted in HPI or Assessment and Plan.  Objective  Well appearing patient in no apparent distress; mood and affect are within normal limits.  A focused examination was performed including scalp . Relevant physical exam findings are noted in the Assessment and Plan.  Objective  scalp: Diffuse greasy scale   Objective  Head - Anterior (Face): Hair thinning on the crown with focal patches of scarring alopecia, decreased alopecia, 2.0 cm scarring alopecia patch at R crown of scalp . Good hair regrowth noted on temporal/frontal scalp and L crown when compared to previous photos in [employer] from 6 mos ago.  Images           Assessment & Plan  Seborrheic dermatitis scalp  Controlled Continue ketoconazole shampoo and Dermasmoothe oil 2x/week  Fluocinolone Acetonide Body 0.01 % OIL - scalp  ketoconazole (NIZORAL) 2 % shampoo - scalp  Central centrifugal scarring alopecia Head - Anterior (Face)  [facility]- improving compared to prior visit photos Discussed with pt scarred area will not regrow hair  Cont Rogaine 5% qhs as directed  Continue avoidance chemicals/relaxers, braids, tight hair styles   Return in about 6 months (around 06/19/2020) for scarring alopecia.   I, Angelique Holm, CMA, am acting as scribe for [name], MD .  Documentation: I have reviewed the above documentation for accuracy and completeness, and I agree with the above.  [name] MD

## 2020-02-19 ENCOUNTER — Ambulatory Visit (INDEPENDENT_AMBULATORY_CARE_PROVIDER_SITE_OTHER): Payer: 59

## 2020-02-19 DIAGNOSIS — Z23 Encounter for immunization: Secondary | ICD-10-CM

## 2020-06-04 ENCOUNTER — Other Ambulatory Visit: Payer: Self-pay | Admitting: Internal Medicine

## 2020-06-04 DIAGNOSIS — Z1231 Encounter for screening mammogram for malignant neoplasm of breast: Secondary | ICD-10-CM

## 2020-06-23 ENCOUNTER — Ambulatory Visit: Payer: 59 | Admitting: Dermatology

## 2020-06-26 ENCOUNTER — Encounter: Payer: 59 | Admitting: Internal Medicine

## 2020-06-30 ENCOUNTER — Ambulatory Visit: Payer: 59 | Admitting: Dermatology

## 2020-07-17 ENCOUNTER — Ambulatory Visit
Admission: RE | Admit: 2020-07-17 | Discharge: 2020-07-17 | Disposition: A | Discharge status: Home / Self Care | Payer: 59 | Source: Ambulatory Visit | Attending: Internal Medicine | Admitting: Internal Medicine

## 2020-07-17 ENCOUNTER — Other Ambulatory Visit: Payer: Self-pay

## 2020-07-17 DIAGNOSIS — Z1231 Encounter for screening mammogram for malignant neoplasm of breast: Secondary | ICD-10-CM

## 2020-07-24 ENCOUNTER — Telehealth: Payer: Self-pay

## 2020-07-24 ENCOUNTER — Telehealth (INDEPENDENT_AMBULATORY_CARE_PROVIDER_SITE_OTHER): Payer: 59 | Admitting: Family Medicine

## 2020-07-24 DIAGNOSIS — R6889 Other general symptoms and signs: Secondary | ICD-10-CM | POA: Diagnosis not present

## 2020-07-24 NOTE — Telephone Encounter (Signed)
[facility] - Client TELEPHONE ADVICE RECORD [facility] Patient Name: Winebarger PLAKE Gender: Female DOB: [birth year] Age: 45 Y 5 M 7 D Return Phone Number: [phone] (Primary), [phone] (Secondary) Address[place] [address] Client [facility] Day - Client Client Site [facility] - Day Contact Type Call Who Is Calling Patient / Member / Family / Caregiver Call Type Triage / Clinical Relationship To Patient Self Return Phone Number [phone] (Primary) Chief Complaint Headache Reason for Call Symptomatic / Request for Health Information Initial Comment Caller had negative covid test on Wednesday but she's having body and headaches Translation No Nurse Assessment Nurse: Annye English, RN, Angelique Blonder Date/Time (Eastern Time): 07/24/2020 9:15:01 AM Confirm and document reason for call. If symptomatic, describe symptoms. ---Caller had negative covid test on Wednesday but she's having body aches. HA resolved. Does the patient have any new or worsening symptoms? ---Yes Will a triage be completed? ---Yes Related visit to physician within the last 2 weeks? ---No Does the PT have any chronic conditions? (i.e. diabetes, asthma, this includes High risk factors for pregnancy, etc.) ---No Is the patient pregnant or possibly pregnant? (Ask all females between the ages of 60-55) ---No Is this a behavioral health or substance abuse call? ---No Guidelines Guideline Title Affirmed Question Affirmed Notes Nurse Date/Time (Eastern Time) Muscle Aches and Body Pain [1] MILD pain (e.g., does not interfere with normal activities) AND [2] present < 7 days Carmon, RN, [facility] 07/24/2020 9:16:47 AM Disp. Time Lamount Cohen Time) Disposition Final User 07/24/2020 8:53:38 AM Attempt made - message left Carmon, RN, Angelique Blonder 07/24/2020 9:18:37 AM Home Care Yes Carmon, RN, Leighton Ruff Disagree/Comply Comply Caller  Understands Yes PLEASE NOTE: All timestamps contained within this report are represented as [place] Standard Time. CONFIDENTIALTY NOTICE: This fax transmission is intended only for the addressee. It contains information that is legally privileged, confidential or otherwise protected from use or disclosure. If you are not the intended recipient, you are strictly prohibited from reviewing, disclosing, copying using or disseminating any of this information or taking any action in reliance on or regarding this information. If you have received this fax in error, please notify us immediately by telephone so that we can arrange for its return to [place]. Phone: [phone], Toll-Free: [phone], Fax: [phone] Page: 2 of 2 Call Id: [id number] PreDisposition Call Doctor Care Advice Given Per Guideline HOME CARE: * You should be able to treat this at home. PAIN MEDICINES: * ACETAMINOPHEN - EXTRA STRENGTH TYLENOL: Take 1,000 mg (two 500 mg pills) every 8 hours as needed. Each Extra Strength Tylenol pill has 500 mg of acetaminophen. The most you should take each day is 3,000 mg (6 pills a day). CALL BACK IF: * You become worse CARE ADVICE given per Muscle Aches and Body Pain (Adult) guideline.

## 2020-07-24 NOTE — Telephone Encounter (Signed)
noted 

## 2020-07-24 NOTE — Patient Instructions (Addendum)
   ---------------------------------------------------------------------------------------------------------------------------      WORK SLIP:  Patient Connie [name],  08-Mar-1976, was seen for a medical visit today, 07/24/20 . Please excuse from work for a COVID like illness. We advise 10 days minimum from the onset of symptoms (07/20/20) PLUS 1 day of no fever and improved symptoms. Will defer to employer for a sooner return to work if if Covid test is negative and symptoms have resolved, OR it is greater than 5 days since the positive test and the patient can wear a high-quality, tight fitting mask such as N95 or KN95 at all times for an additional 5 days. Would also suggest COVID19 antigen testing is negative prior to return.  Sincerely: E-signature: Dr. Kriste Basque, DO [facility] Ph: [phone]   ------------------------------------------------------------------------------------------------------------------------------     HOME CARE TIPS:    -can use tylenol or aleve if needed for fevers, aches and pains per instructions  -can use nasal saline a few times per day if you have nasal congestion  -stay hydrated, drink plenty of fluids and eat small healthy meals  -can take 1000 IU ( ) Vit D3 and 100-500 mg of Vit C daily per instructions  -If the Covid test is positive, check out the CDC website for more information on home care, transmission and treatment for COVID19  -follow up with your doctor in 2-3 days unless improving and feeling better  -stay home while sick, except to seek medical care, and if you have COVID19 ideally it would be best to stay home for a full 10 days since the onset of symptoms PLUS one day of no fever and feeling better. Wear a good mask (such as N95 or KN95) if around others to reduce the risk of transmission.  It was nice to meet you today, and I really hope you are feeling better soon. I help [facility] out with  telemedicine visits on Tuesdays and Thursdays and am available for visits on those days. If you have any concerns or questions following this visit please schedule a follow up visit with your Primary Care doctor or seek care at a local urgent care clinic to avoid delays in care.    Seek in person care or schedule a follow up video visit promptly if your symptoms worsen, new concerns arise or you are not improving with treatment. Call 911 and/or seek emergency care if your symptoms are severe or life threatening.

## 2020-07-24 NOTE — Telephone Encounter (Signed)
Per appt notes pt already has video visit scheduled 07/24/20 at 1:40 with Dr Selena Batten. Sending note to Pamala Hurry  NP as PCP and Dr Selena Batten.

## 2020-07-24 NOTE — Progress Notes (Signed)
Virtual Visit via Video Note  I connected with Connie [name]  on 07/24/20 at  1:40 PM EST by a video enabled telemedicine application and verified that I am speaking with the correct person using two identifiers.  Location patient: home, [place] Location provider:work or home office Persons participating in the virtual visit: patient, provider  I discussed the limitations of evaluation and management by telemedicine and the availability of in person appointments. The patient expressed understanding and agreed to proceed.   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  Past Medical History:  Diagnosis Date  . Acne   . Dermatitis     Past Surgical History:  Procedure Laterality Date  . CESAREAN SECTION       Current Outpatient Medications:  .  Ascorbic Acid (VITAMIN C PO), Take by mouth daily., Disp: , Rfl:  .  Cholecalciferol (VITAMIN D3 PO), Take by mouth daily., Disp: , Rfl:  .  fluconazole (DIFLUCAN) 100 MG tablet, Take 1 tablet (100 mg total) by mouth daily., Disp: 7 tablet, Rfl: 0 .  Fluocinolone Acetonide Body 0.01 % OIL, Apply to scalp 2 nights a week cover with a cap, shampoo in the morning, Disp: 120 mL, Rfl: 5 .  ketoconazole (NIZORAL) 2 % shampoo, Apply 1 application topically 2 (two) times a week., Disp: 120 mL, Rfl: 5 .  Multiple Vitamin (MULTIVITAMIN ADULT PO), Take by mouth daily., Disp: , Rfl:  .  Vitamin D, Ergocalciferol, (DRISDOL) 1.25 MG  (50000 UT) CAPS capsule, Take 1 capsule (50,000 Units total) by mouth every 7 (seven) days., Disp: 12 capsule, Rfl: 0  EXAM:  VITALS per patient if applicable:  GENERAL: alert, oriented, appears well and in no acute distress  HEENT: atraumatic, conjunttiva clear, no obvious abnormalities on inspection of external nose and ears  NECK: normal movements of the head and neck  LUNGS: on inspection no signs of respiratory distress, breathing rate appears normal, no obvious gross SOB, gasping or wheezing  CV: no obvious cyanosis  MS: moves all visible extremities without noticeable abnormality  PSYCH/NEURO: pleasant and cooperative, no obvious depression or anxiety, speech and thought processing grossly intact  ASSESSMENT AND PLAN:  Discussed the following assessment and plan:  Influenza-like symptoms  -we discussed possible serious and likely etiologies, options for evaluation and workup, limitations of telemedicine visit vs in person visit, treatment, treatment risks and precautions. Pt prefers to treat via telemedicine empirically rather than in person at this moment.  Query influenza, viral illness, COVID-19 with false negative test versus other.  We have seen quite a few cases of COVID-19 with early testing negative.  Patient already has another Covid test pending, as she is suspicious that she has Covid.  She did another test today, I PCR test.  Since fevers have trended down, and no severe symptoms, of opted for symptomatic care for now with Aleve or Tylenol if needed, staying hydrated and other home care tips summarized in patient instructions.  Discussed if Covid testing is  positive, options for treatment, potential complications, precautions and isolation.  She did request a work note. Work/School slipped offered: provided in patient instructions Scheduled follow up with PCP offered: Agrees to schedule follow-up through PCP office if needed. Advised to seek prompt in person care if  worsening, new symptoms arise, or if is not continuing to improve over the next several days. Discussed options for inperson care if PCP office not available. Did let this patient know that I only do telemedicine on Tuesdays and Thursdays for [place]. Advised to schedule follow up visit with PCP or [facility] if any further questions or concerns to avoid delays in care.   I discussed the assessment and treatment plan with the patient. The patient was provided an opportunity to ask questions and all were answered. The patient agreed with the plan and demonstrated an understanding of the instructions.     Terressa Koyanagi, DO

## 2020-07-26 ENCOUNTER — Encounter: Payer: Self-pay | Admitting: Emergency Medicine

## 2020-07-26 ENCOUNTER — Ambulatory Visit
Admission: EM | Admit: 2020-07-26 | Discharge: 2020-07-26 | Disposition: A | Discharge status: Home / Self Care | Payer: 59 | Attending: Emergency Medicine | Admitting: Emergency Medicine

## 2020-07-26 ENCOUNTER — Other Ambulatory Visit: Payer: Self-pay

## 2020-07-26 DIAGNOSIS — J069 Acute upper respiratory infection, unspecified: Secondary | ICD-10-CM | POA: Diagnosis not present

## 2020-07-26 MED ORDER — IBUPROFEN 800 MG PO TABS: 800.0000 mg | ORAL_TABLET | Freq: Three times a day (TID) | ORAL | 0 refills | Status: DC

## 2020-07-26 MED ORDER — CETIRIZINE HCL 10 MG PO CAPS: 10.0000 mg | ORAL_CAPSULE | Freq: Every day | ORAL | 0 refills | Status: DC

## 2020-07-26 MED ORDER — BENZONATATE 100 MG PO CAPS: 200.0000 mg | ORAL_CAPSULE | Freq: Three times a day (TID) | ORAL | 0 refills | Status: DC

## 2020-07-26 MED ORDER — FLUTICASONE PROPIONATE 50 MCG/ACT NA SUSP: 1.0000 | Freq: Every day | NASAL | 0 refills | Status: DC

## 2020-07-26 NOTE — ED Triage Notes (Signed)
Pt here for URI sx with nasal congestion and body aches x 6 days; negative covid test 2 days ago

## 2020-07-26 NOTE — Discharge Instructions (Addendum)
COVID/flu test pending-monitor MyChart for results Daily cetirizine and Flonase to help with congestion drainage and throat irritation Tessalon every 8 hours for cough as needed Tylenol and ibuprofen for headaches, body aches, any fevers Rest and fluids Follow-up if not improving or worsening

## 2020-07-26 NOTE — ED Provider Notes (Signed)
[facility]    CSN: [id number] Arrival date & time: 07/26/20  [id number]      History   Chief Complaint Chief Complaint  Patient presents with  . URI    HPI Analy [name] is a 45 y.o. female presenting today for evaluation of URI symptoms.  Reports that she has had headache body aches chills and sore throat.  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  Has had mild congestion mild sore throat and mild cough.  Main complaint has been frontal headache.  HPI  Past Medical History:  Diagnosis Date  . Acne   . Dermatitis     Patient Active Problem List   Diagnosis Date Noted  . Recurrent vaginitis 08/14/2019    Past Surgical History:  Procedure Laterality Date  . CESAREAN SECTION      OB History   No obstetric history on file.      Home Medications    Prior to Admission medications   Medication Sig Start Date End Date Taking? Authorizing Provider  benzonatate (TESSALON) 100 MG capsule Take 2 capsules (200 mg total) by mouth every 8 (eight) hours. 07/26/20  Yes Quintus Premo C, PA-C  Cetirizine HCl 10 MG CAPS Take 1 capsule (10 mg total) by mouth daily for 10 days. 07/26/20 08/05/20 Yes Nethan Caudillo C, PA-C  fluticasone (FLONASE) 50 MCG/ACT nasal spray Place 1-2 sprays into both nostrils daily. 07/26/20  Yes Robbi Scurlock C, PA-C  ibuprofen (ADVIL) 800 MG tablet Take 1 tablet (800 mg total) by mouth 3 (three) times daily. 07/26/20  Yes Kayliah Tindol C, PA-C  Ascorbic Acid (VITAMIN C PO) Take by mouth daily.    [provider]  Cholecalciferol (VITAMIN D3 PO) Take by mouth daily.    [provider]  fluconazole (DIFLUCAN) 100 MG tablet Take 1 tablet (100 mg total) by mouth daily. Patient not taking: Reported on 07/26/2020 08/28/19   Lorre Munroe, NP  Fluocinolone Acetonide Body 0.01 % OIL Apply to scalp 2 nights a week cover with a cap, shampoo  in the morning 12/18/19   Deirdre Evener, MD  ketoconazole (NIZORAL) 2 % shampoo Apply 1 application topically 2 (two) times a week. 12/21/19   Deirdre Evener, MD  Multiple Vitamin (MULTIVITAMIN ADULT PO) Take by mouth daily.    [provider]  Vitamin D, Ergocalciferol, (DRISDOL) 1.25 MG (50000 UT) CAPS capsule Take 1 capsule (50,000 Units total) by mouth every 7 (seven) days. 12/26/18   Lorre Munroe, NP    Family History Family History  Problem Relation Age of Onset  . Breast cancer Maternal Grandmother   . Thyroid cancer Mother   . Hypertension Father     Social History Social History   Tobacco Use  . Smoking status: Never Smoker  . Smokeless tobacco: Never Used  Substance Use Topics  . Alcohol use: Yes    Comment: occasional  . Drug use: Never     Allergies   Latex   Review of Systems Review of Systems  Constitutional: Positive for chills and fatigue. Negative for activity change, appetite change and fever.  HENT: Positive for congestion, rhinorrhea, sinus pressure and sore throat. Negative for ear pain and trouble swallowing.   Eyes: Negative for discharge and redness.  Respiratory: Positive for cough. Negative for chest tightness and shortness of breath.   Cardiovascular: Negative for chest pain.  Gastrointestinal: Negative  for abdominal pain, diarrhea, nausea and vomiting.  Musculoskeletal: Negative for myalgias.  Skin: Negative for rash.  Neurological: Positive for headaches. Negative for dizziness and light-headedness.     Physical Exam Triage Vital Signs ED Triage Vitals  Enc Vitals Group     BP      Pulse      Resp      Temp      Temp src      SpO2      Weight      Height      Head Circumference      Peak Flow      Pain Score      Pain Loc      Pain Edu?      Excl. in GC?    No data found.  Updated Vital Signs BP 107/69 (BP Location: Left Arm)   Pulse 75   Temp 98.2 F (36.8 C) (Oral)   Resp 18   LMP 07/07/2020   SpO2  95%   Visual Acuity Right Eye Distance:   Left Eye Distance:   Bilateral Distance:    Right Eye Near:   Left Eye Near:    Bilateral Near:     Physical Exam Vitals and nursing note reviewed.  Constitutional:      Appearance: She is well-developed and well-nourished.     Comments: No acute distress  HENT:     Head: Normocephalic and atraumatic.     Ears:     Comments: Bilateral ears without tenderness to palpation of external auricle, tragus and mastoid, EAC's without erythema or swelling, TM's with good bony landmarks and cone of light. Non erythematous.     Nose: Nose normal.     Mouth/Throat:     Comments: Oral mucosa pink and moist, no tonsillar enlargement or exudate. Posterior pharynx patent and nonerythematous, no uvula deviation or swelling. Normal phonation. Eyes:     Conjunctiva/sclera: Conjunctivae normal.  Cardiovascular:     Rate and Rhythm: Normal rate.  Pulmonary:     Effort: Pulmonary effort is normal. No respiratory distress.     Comments: Breathing comfortably at rest, CTABL, no wheezing, rales or other adventitious sounds auscultated Abdominal:     General: There is no distension.  Musculoskeletal:        General: Normal range of motion.     Cervical back: Neck supple.  Skin:    General: Skin is warm and dry.  Neurological:     Mental Status: She is alert and oriented to person, place, and time.  Psychiatric:        Mood and Affect: Mood and affect normal.      UC Treatments / Results  Labs (all labs ordered are listed, but only abnormal results are displayed) Labs Reviewed  COVID-19, FLU A+B NAA    EKG   Radiology No results found.  Procedures Procedures (including critical care time)  Medications Ordered in UC Medications - No data to display  Initial Impression / Assessment and Plan / UC Course  I have reviewed the triage vital signs and the nursing notes.  Pertinent labs & imaging results that were available during my care of  the patient were reviewed by me and considered in my medical decision making (see chart for details).     Viral URI with cough-6-7 days of symptoms, checking flu per patient request, recommending to continue symptomatic and supportive care at this time, if symptoms not improving over the next 3 to 4  days may consider antibiotic therapy to treat sinusitis.  Discussed strict return precautions. Patient verbalized understanding and is agreeable with plan.  Final Clinical Impressions(s) / UC Diagnoses   Final diagnoses:  Viral URI with cough     Discharge Instructions     COVID/flu test pending-monitor MyChart for results Daily cetirizine and Flonase to help with congestion drainage and throat irritation Tessalon every 8 hours for cough as needed Tylenol and ibuprofen for headaches, body aches, any fevers Rest and fluids Follow-up if not improving or worsening    ED Prescriptions    Medication Sig Dispense Auth. Provider   ibuprofen (ADVIL) 800 MG tablet Take 1 tablet (800 mg total) by mouth 3 (three) times daily. 21 tablet Sheridan Hew C, PA-C   fluticasone (FLONASE) 50 MCG/ACT nasal spray Place 1-2 sprays into both nostrils daily. 16 g Princetta Uplinger C, PA-C   Cetirizine HCl 10 MG CAPS Take 1 capsule (10 mg total) by mouth daily for 10 days. 10 capsule Kaytlynne Neace C, PA-C   benzonatate (TESSALON) 100 MG capsule Take 2 capsules (200 mg total) by mouth every 8 (eight) hours. 30 capsule Sherald Balbuena, [place] C, PA-C     PDMP not reviewed this encounter.   Lew Dawes, [place] 07/26/20 [phone]

## 2020-07-27 LAB — COVID-19, FLU A+B NAA
Influenza A, NAA: NOT DETECTED
Influenza B, NAA: NOT DETECTED
SARS-CoV-2, NAA: NOT DETECTED

## 2020-08-07 ENCOUNTER — Other Ambulatory Visit: Payer: Self-pay

## 2020-08-07 ENCOUNTER — Ambulatory Visit (INDEPENDENT_AMBULATORY_CARE_PROVIDER_SITE_OTHER): Payer: 59 | Admitting: Internal Medicine

## 2020-08-07 ENCOUNTER — Encounter: Payer: Self-pay | Admitting: Internal Medicine

## 2020-08-07 VITALS — BP 108/76 | HR 67 | Temp 98.6°F | Ht 63.5 in | Wt 121.0 lb

## 2020-08-07 DIAGNOSIS — Z0000 Encounter for general adult medical examination without abnormal findings: Secondary | ICD-10-CM

## 2020-08-07 DIAGNOSIS — Z1159 Encounter for screening for other viral diseases: Secondary | ICD-10-CM | POA: Diagnosis not present

## 2020-08-07 DIAGNOSIS — Z114 Encounter for screening for human immunodeficiency virus [HIV]: Secondary | ICD-10-CM | POA: Diagnosis not present

## 2020-08-07 NOTE — Progress Notes (Signed)
Subjective:    Patient ID: Connie [name], female    DOB: [birth year], 45 y.o.   MRN: [id number]  HPI  Patient presents the clinic today for her annual exam.  Flu: 02/2020 Tetanus: 12/2018 Covid: Pfizer x 3 Pap Smear: 08/2019 Mammogram: 07/2020 Vision Screening: annually Dentist: biannually  Diet: She does eat meat.  She consumes fruits and vegetables.  She tries to avoid fried foods.  She drinks mostly water. Exercise: She rides her bike 4 times weekly  Review of Systems      Past Medical History:  Diagnosis Date  . Acne   . Dermatitis     Current Outpatient Medications  Medication Sig Dispense Refill  . Ascorbic Acid (VITAMIN C PO) Take by mouth daily.    . benzonatate (TESSALON) 100 MG capsule Take 2 capsules (200 mg total) by mouth every 8 (eight) hours. 30 capsule 0  . Cholecalciferol (VITAMIN D3 PO) Take by mouth daily.    . fluconazole (DIFLUCAN) 100 MG tablet Take 1 tablet (100 mg total) by mouth daily. 7 tablet 0  . Fluocinolone Acetonide Body 0.01 % OIL Apply to scalp 2 nights a week cover with a cap, shampoo in the morning 120 mL 5  . fluticasone (FLONASE) 50 MCG/ACT nasal spray Place 1-2 sprays into both nostrils daily. 16 g 0  . ibuprofen (ADVIL) 800 MG tablet Take 1 tablet (800 mg total) by mouth 3 (three) times daily. 21 tablet 0  . ketoconazole (NIZORAL) 2 % shampoo Apply 1 application topically 2 (two) times a week. 120 mL 5  . Multiple Vitamin (MULTIVITAMIN ADULT PO) Take by mouth daily.    . Vitamin D, Ergocalciferol, (DRISDOL) 1.25 MG (50000 UT) CAPS capsule Take 1 capsule (50,000 Units total) by mouth every 7 (seven) days. 12 capsule 0  . Cetirizine HCl 10 MG CAPS Take 1 capsule (10 mg total) by mouth daily for 10 days. 10 capsule 0   No current facility-administered medications for this visit.    Allergies  Allergen Reactions  . Latex Rash    Also causes itching    Family History  Problem Relation Age of Onset  . Breast cancer Maternal  Grandmother   . Thyroid cancer Mother   . Hypertension Father     Social History   Socioeconomic History  . Marital status: Single    Spouse name: Not on file  . Number of children: Not on file  . Years of education: Not on file  . Highest education level: Not on file  Occupational History  . Not on file  Tobacco Use  . Smoking status: Never Smoker  . Smokeless tobacco: Never Used  Substance and Sexual Activity  . Alcohol use: Yes    Comment: occasional  . Drug use: Never  . Sexual activity: Not on file  Other Topics Concern  . Not on file  Social History Narrative  . Not on file   Social Determinants of Health   Financial Resource Strain: Not on file  Food Insecurity: Not on file  Transportation Needs: Not on file  Physical Activity: Not on file  Stress: Not on file  Social Connections: Not on file  Intimate Partner Violence: Not on file     Constitutional: Denies fever, malaise, fatigue, headache or abrupt weight changes.  HEENT: Denies eye pain, eye redness, ear pain, ringing in the ears, wax buildup, runny nose, nasal congestion, bloody nose, or sore throat. Respiratory: Denies difficulty breathing, shortness of breath, cough or sputum production.  Cardiovascular: Denies chest pain, chest tightness, palpitations or swelling in the hands or feet.  Gastrointestinal: Denies abdominal pain, bloating, constipation, diarrhea or blood in the stool.  GU: Denies urgency, frequency, pain with urination, burning sensation, blood in urine, odor or discharge. Musculoskeletal: Denies decrease in range of motion, difficulty with gait, muscle pain or joint pain and swelling.  Skin: Denies redness, rashes, lesions or ulcercations.  Neurological: Denies dizziness, difficulty with memory, difficulty with speech or problems with balance and coordination.  Psych: Denies anxiety, depression, SI/HI.  No other specific complaints in a complete review of systems (except as listed in  HPI above).  Objective:   Physical Exam   BP 108/76   Pulse 67   Temp 98.6 F (37 C) (Temporal)   Ht 5' 3.5" (1.613 m)   Wt 121 lb (54.9 kg)   SpO2 98%   BMI 21.10 kg/m   Wt Readings from Last 3 Encounters:  09/19/19 119 lb (54 kg)  08/28/19 117 lb (53.1 kg)  08/22/19 118 lb (53.5 kg)    General: Appears her stated age, well developed, well nourished in NAD. Skin: Warm, dry and intact. No rashes hair noted. HEENT: Head: normal shape and size; Eyes: sclera white, no icterus, conjunctiva pink, PERRLA and EOMs intact;  Neck: Neck supple, trachea midline. No masses, lumps or thyromegaly present.  Cardiovascular: Normal rate and rhythm. S1,S2 noted.  No murmur, rubs or gallops noted. No JVD or BLE edema.  Pulmonary/Chest: Normal effort and positive vesicular breath sounds. No respiratory distress. No wheezes, rales or ronchi noted.  Abdomen: Soft and nontender. Normal bowel sounds. No distention or masses noted. Liver, spleen and kidneys non palpable. Musculoskeletal: Strength 5/5 BUE/BLE.  No difficulty with gait.  Neurological: Alert and oriented. Cranial nerves II-XII grossly intact. Coordination normal.  Psychiatric: Mood and affect normal. Behavior is normal. Judgment and thought content normal.     BMET    Component Value Date/Time   NA 138 12/24/2018 1506   K 3.6 12/24/2018 1506   CL 105 12/24/2018 1506   CO2 28 12/24/2018 1506   GLUCOSE 101 (H) 12/24/2018 1506   BUN 12 12/24/2018 1506   CREATININE 0.85 12/24/2018 1506   CALCIUM 9.0 12/24/2018 1506    Lipid Panel     Component Value Date/Time   CHOL 160 12/24/2018 1506   TRIG 47.0 12/24/2018 1506   HDL 57.70 12/24/2018 1506   CHOLHDL 3 12/24/2018 1506   VLDL 9.4 12/24/2018 1506   LDLCALC 93 12/24/2018 1506    CBC    Component Value Date/Time   WBC 5.5 12/24/2018 1506   RBC 3.67 (L) 12/24/2018 1506   HGB 12.2 12/24/2018 1506   HCT 35.6 (L) 12/24/2018 1506   PLT 217.0 12/24/2018 1506   MCV 97.2  12/24/2018 1506   MCHC 34.2 12/24/2018 1506   RDW 12.0 12/24/2018 1506    Hgb A1C No results found for: HGBA1C         Assessment & Plan:   Preventative Health Maintenance:  Flu shot UTD Tetanus UTD COVID UTD Pap smear UTD Mammogram UTD Encouraged her to consume a balanced diet and exercise regimen Advised her to see an eye doctor and dentist annually We will check CBC, c-Met, TSH, lipid, HIV and hep C today  RTC in 1 year, sooner if needed Webb Silversmith, NP This visit occurred during the SARS-CoV-2 public health emergency.  Safety protocols were in place, including screening questions prior to the visit, additional usage of staff  PPE, and extensive cleaning of exam room while observing appropriate contact time as indicated for disinfecting solutions.

## 2020-08-10 LAB — COMPREHENSIVE METABOLIC PANEL
AG Ratio: 1.4 (calc) (ref 1.0–2.5)
ALT: 12 U/L (ref 6–29)
AST: 17 U/L (ref 10–30)
Albumin: 4.1 g/dL (ref 3.6–5.1)
Alkaline phosphatase (APISO): 48 U/L (ref 31–125)
BUN: 14 mg/dL (ref 7–25)
CO2: 24 mmol/L (ref 20–32)
Calcium: 9.2 mg/dL (ref 8.6–10.2)
Chloride: 104 mmol/L (ref 98–110)
Creat: 0.7 mg/dL (ref 0.50–1.10)
Globulin: 3 g/dL (calc) (ref 1.9–3.7)
Glucose, Bld: 77 mg/dL (ref 65–99)
Potassium: 3.9 mmol/L (ref 3.5–5.3)
Sodium: 137 mmol/L (ref 135–146)
Total Bilirubin: 0.4 mg/dL (ref 0.2–1.2)
Total Protein: 7.1 g/dL (ref 6.1–8.1)

## 2020-08-10 LAB — HEPATITIS C ANTIBODY
Hepatitis C Ab: NONREACTIVE
SIGNAL TO CUT-OFF: 0 (ref <1.00)

## 2020-08-10 LAB — CBC
HCT: 34.5 % — ABNORMAL LOW (ref 35.0–45.0)
Hemoglobin: 11.7 g/dL (ref 11.7–15.5)
MCH: 33.2 pg — ABNORMAL HIGH (ref 27.0–33.0)
MCHC: 33.9 g/dL (ref 32.0–36.0)
MCV: 98 fL (ref 80.0–100.0)
MPV: 11.3 fL (ref 7.5–12.5)
Platelets: 229 10*3/uL (ref 140–400)
RBC: 3.52 10*6/uL — ABNORMAL LOW (ref 3.80–5.10)
RDW: 11.1 % (ref 11.0–15.0)
WBC: 6.4 10*3/uL (ref 3.8–10.8)

## 2020-08-10 LAB — LIPID PANEL
Cholesterol: 182 mg/dL (ref <200)
HDL: 63 mg/dL (ref >=50)
LDL Cholesterol (Calc): 105 mg/dL (calc) — ABNORMAL HIGH
Non-HDL Cholesterol (Calc): 119 mg/dL (calc) (ref <130)
Total CHOL/HDL Ratio: 2.9 (calc) (ref <5.0)
Triglycerides: 54 mg/dL (ref <150)

## 2020-08-10 LAB — TSH: TSH: 1.34 mIU/L

## 2020-08-10 LAB — HIV ANTIBODY (ROUTINE TESTING W REFLEX): HIV 1&2 Ab, 4th Generation: NONREACTIVE

## 2020-08-11 ENCOUNTER — Encounter: Payer: Self-pay | Admitting: Internal Medicine

## 2020-08-11 NOTE — Patient Instructions (Signed)
Health Maintenance, Female Adopting a healthy lifestyle and getting preventive care are important in promoting health and wellness. Ask your health care provider about:  The right schedule for you to have regular tests and exams.  Things you can do on your own to prevent diseases and keep yourself healthy. What should I know about diet, weight, and exercise? Eat a healthy diet  Eat a diet that includes plenty of vegetables, fruits, low-fat dairy products, and lean protein.  Do not eat a lot of foods that are high in solid fats, added sugars, or sodium.   Maintain a healthy weight Body mass index (BMI) is used to identify weight problems. It estimates body fat based on height and weight. Your health care provider can help determine your BMI and help you achieve or maintain a healthy weight. Get regular exercise Get regular exercise. This is one of the most important things you can do for your health. Most adults should:  Exercise for at least 150 minutes each week. The exercise should increase your heart rate and make you sweat (moderate-intensity exercise).  Do strengthening exercises at least twice a week. This is in addition to the moderate-intensity exercise.  Spend less time sitting. Even light physical activity can be beneficial. Watch cholesterol and blood lipids Have your blood tested for lipids and cholesterol at 44 years of age, then have this test every 5 years. Have your cholesterol levels checked more often if:  Your lipid or cholesterol levels are high.  You are older than 44 years of age.  You are at high risk for heart disease. What should I know about cancer screening? Depending on your health history and family history, you may need to have cancer screening at various ages. This may include screening for:  Breast cancer.  Cervical cancer.  Colorectal cancer.  Skin cancer.  Lung cancer. What should I know about heart disease, diabetes, and high blood  pressure? Blood pressure and heart disease  High blood pressure causes heart disease and increases the risk of stroke. This is more likely to develop in people who have high blood pressure readings, are of African descent, or are overweight.  Have your blood pressure checked: ? Every 3-5 years if you are 18-39 years of age. ? Every year if you are 40 years old or older. Diabetes Have regular diabetes screenings. This checks your fasting blood sugar level. Have the screening done:  Once every three years after age 40 if you are at a normal weight and have a low risk for diabetes.  More often and at a younger age if you are overweight or have a high risk for diabetes. What should I know about preventing infection? Hepatitis B If you have a higher risk for hepatitis B, you should be screened for this virus. Talk with your health care provider to find out if you are at risk for hepatitis B infection. Hepatitis C Testing is recommended for:  Everyone born from 1945 through 1965.  Anyone with known risk factors for hepatitis C. Sexually transmitted infections (STIs)  Get screened for STIs, including gonorrhea and chlamydia, if: ? You are sexually active and are younger than 44 years of age. ? You are older than 44 years of age and your health care provider tells you that you are at risk for this type of infection. ? Your sexual activity has changed since you were last screened, and you are at increased risk for chlamydia or gonorrhea. Ask your health care provider   if you are at risk.  Ask your health care provider about whether you are at high risk for HIV. Your health care provider may recommend a prescription medicine to help prevent HIV infection. If you choose to take medicine to prevent HIV, you should first get tested for HIV. You should then be tested every 3 months for as long as you are taking the medicine. Pregnancy  If you are about to stop having your period (premenopausal) and  you may become pregnant, seek counseling before you get pregnant.  Take 400 to 800 micrograms (mcg) of folic acid every day if you become pregnant.  Ask for birth control (contraception) if you want to prevent pregnancy. Osteoporosis and menopause Osteoporosis is a disease in which the bones lose minerals and strength with aging. This can result in bone fractures. If you are 65 years old or older, or if you are at risk for osteoporosis and fractures, ask your health care provider if you should:  Be screened for bone loss.  Take a calcium or vitamin D supplement to lower your risk of fractures.  Be given hormone replacement therapy (HRT) to treat symptoms of menopause. Follow these instructions at home: Lifestyle  Do not use any products that contain nicotine or tobacco, such as cigarettes, e-cigarettes, and chewing tobacco. If you need help quitting, ask your health care provider.  Do not use street drugs.  Do not share needles.  Ask your health care provider for help if you need support or information about quitting drugs. Alcohol use  Do not drink alcohol if: ? Your health care provider tells you not to drink. ? You are pregnant, may be pregnant, or are planning to become pregnant.  If you drink alcohol: ? Limit how much you use to 0-1 drink a day. ? Limit intake if you are breastfeeding.  Be aware of how much alcohol is in your drink. In the [place] one drink equals one 12 oz bottle of beer (355 mL), one 5 oz glass of wine (148 mL), or one 1 oz glass of hard liquor (44 mL). General instructions  Schedule regular health, dental, and eye exams.  Stay current with your vaccines.  Tell your health care provider if: ? You often feel depressed. ? You have ever been abused or do not feel safe at home. Summary  Adopting a healthy lifestyle and getting preventive care are important in promoting health and wellness.  Follow your health care provider's instructions about healthy  diet, exercising, and getting tested or screened for diseases.  Follow your health care provider's instructions on monitoring your cholesterol and blood pressure. This information is not intended to replace advice given to you by your health care provider. Make sure you discuss any questions you have with your health care provider. Document Revised: 05/16/2018 Document Reviewed: 05/16/2018 Elsevier Patient Education  2021 [employer]  

## 2020-08-13 ENCOUNTER — Ambulatory Visit (INDEPENDENT_AMBULATORY_CARE_PROVIDER_SITE_OTHER): Payer: 59 | Admitting: Internal Medicine

## 2020-08-13 ENCOUNTER — Encounter: Payer: Self-pay | Admitting: Internal Medicine

## 2020-08-13 ENCOUNTER — Other Ambulatory Visit: Payer: Self-pay

## 2020-08-13 VITALS — BP 106/64 | HR 84 | Temp 97.5°F | Wt 121.0 lb

## 2020-08-13 DIAGNOSIS — N898 Other specified noninflammatory disorders of vagina: Secondary | ICD-10-CM | POA: Diagnosis not present

## 2020-08-13 MED ORDER — FLUCONAZOLE 150 MG PO TABS: 150.0000 mg | ORAL_TABLET | Freq: Once | ORAL | 0 refills | Status: AC

## 2020-08-13 NOTE — Progress Notes (Signed)
Subjective:    Patient ID: Connie [name], female    DOB: April 23, 1976, 45 y.o.   MRN: [id number]  HPI  Pt presents to the clinic today with c/o vaginal itching and discharge. This started 3 days ago. She denies pelvic pain, odor, dyspareunia, abnormal uterine bleeding. She has had issues with recurrent vaginitis in the past. She does take bubble baths.  Review of Systems      Past Medical History:  Diagnosis Date  . Acne   . Dermatitis     Current Outpatient Medications  Medication Sig Dispense Refill  . Ascorbic Acid (VITAMIN C PO) Take by mouth daily.    . Cholecalciferol (VITAMIN D3 PO) Take by mouth daily.    . Fluocinolone Acetonide Body 0.01 % OIL Apply to scalp 2 nights a week cover with a cap, shampoo in the morning 120 mL 5  . fluticasone (FLONASE) 50 MCG/ACT nasal spray Place 1-2 sprays into both nostrils daily. 16 g 0  . ibuprofen (ADVIL) 800 MG tablet Take 1 tablet (800 mg total) by mouth 3 (three) times daily. 21 tablet 0  . ketoconazole (NIZORAL) 2 % shampoo Apply 1 application topically 2 (two) times a week. 120 mL 5  . Multiple Vitamin (MULTIVITAMIN ADULT PO) Take by mouth daily.     No current facility-administered medications for this visit.    Allergies  Allergen Reactions  . Latex Rash    Also causes itching    Family History  Problem Relation Age of Onset  . Breast cancer Maternal Grandmother   . Thyroid cancer Mother   . Hypertension Father     Social History   Socioeconomic History  . Marital status: Single    Spouse name: Not on file  . Number of children: Not on file  . Years of education: Not on file  . Highest education level: Not on file  Occupational History  . Not on file  Tobacco Use  . Smoking status: Never Smoker  . Smokeless tobacco: Never Used  Substance and Sexual Activity  . Alcohol use: Yes    Comment: occasional  . Drug use: Never  . Sexual activity: Not on file  Other Topics Concern  . Not on file  Social History  Narrative  . Not on file   Social Determinants of Health   Financial Resource Strain: Not on file  Food Insecurity: Not on file  Transportation Needs: Not on file  Physical Activity: Not on file  Stress: Not on file  Social Connections: Not on file  Intimate Partner Violence: Not on file     Constitutional: Denies fever, malaise, fatigue, headache or abrupt weight changes.  Respiratory: Denies difficulty breathing, shortness of breath, cough or sputum production.   Cardiovascular: Denies chest pain, chest tightness, palpitations or swelling in the hands or feet.  Gastrointestinal: Denies abdominal pain, bloating, constipation, diarrhea or blood in the stool.  GU: Pt reports vaginal itching and discharge. Denies urgency, frequency, pain with urination, burning sensation, blood in urine, odor.  No other specific complaints in a complete review of systems (except as listed in HPI above).  Objective:   Physical Exam BP 106/64   Pulse 84   Temp (!) 97.5 F (36.4 C) (Temporal)   Wt 121 lb (54.9 kg)   LMP 08/01/2020   BMI 21.10 kg/m   Wt Readings from Last 3 Encounters:  08/07/20 121 lb (54.9 kg)  09/19/19 119 lb (54 kg)  08/28/19 117 lb (53.1 kg)  General: Appears her stated age, well developed, well nourished in NAD. Neck:  Neck supple, trachea midline. No masses, lumps or thyromegaly present.  Cardiovascular: Normal rate. Pulmonary/Chest: Normal effort. Abdomen: Soft and nontender. Normal bowel sounds. No distention or masses noted.  Pelvic: Self swabbed. Neurological: Alert and oriented.  BMET    Component Value Date/Time   NA 137 08/07/2020 1527   K 3.9 08/07/2020 1527   CL 104 08/07/2020 1527   CO2 24 08/07/2020 1527   GLUCOSE 77 08/07/2020 1527   BUN 14 08/07/2020 1527   CREATININE 0.70 08/07/2020 1527   CALCIUM 9.2 08/07/2020 1527    Lipid Panel     Component Value Date/Time   CHOL 182 08/07/2020 1527   TRIG 54 08/07/2020 1527   HDL 63 08/07/2020  1527   CHOLHDL 2.9 08/07/2020 1527   VLDL 9.4 12/24/2018 1506   LDLCALC 105 (H) 08/07/2020 1527    CBC    Component Value Date/Time   WBC 6.4 08/07/2020 1527   RBC 3.52 (L) 08/07/2020 1527   HGB 11.7 08/07/2020 1527   HCT 34.5 (L) 08/07/2020 1527   PLT 229 08/07/2020 1527   MCV 98.0 08/07/2020 1527   MCH 33.2 (H) 08/07/2020 1527   MCHC 33.9 08/07/2020 1527   RDW 11.1 08/07/2020 1527    Hgb A1C No results found for: HGBA1C          Assessment & Plan:  Vaginal Discharge and Itching:  Will send off wet prep RX for Diflucan 150 mg PO x 1 No indication for STD screening at this time  Return precautions discussed   Nicki Reaper, NP This visit occurred during the SARS-CoV-2 public health emergency.  Safety protocols were in place, including screening questions prior to the visit, additional usage of staff PPE, and extensive cleaning of exam room while observing appropriate contact time as indicated for disinfecting solutions.

## 2020-08-13 NOTE — Patient Instructions (Signed)
Vaginitis  Vaginitis is irritation and swelling of the vagina. Treatment will depend on the cause. What are the causes? It can be caused by:  Bacteria.  Yeast.  A parasite.  A virus.  Low hormone levels.  Bubble baths, scented tampons, and feminine sprays. Other things can change the balance of the yeast and bacteria that live in the vagina. These include:  Antibiotic medicines.  Not being clean enough.  Some birth control methods.  Sex.  Infection.  Diabetes.  A weakened body defense system (immune system). What increases the risk?  Smoking or being around someone who smokes.  Using washes (douches), scented tampons, or scented pads.  Wearing tight pants or thong underwear.  Using birth control pills or an IUD.  Having sex without a condom or having a lot of partners.  Having an STI.  Using a certain product to kill sperm (nonoxynol-9).  Eating foods that are high in sugar.  Having diabetes.  Having low levels of a female hormone.  Having a weakened body defense system.  Being pregnant or breastfeeding. What are the signs or symptoms?  Fluid coming from the vagina that is not normal.  A bad smell.  Itching, pain, or swelling.  Pain with sex.  Pain or burning when you pee (urinate). Sometimes there are no symptoms. How is this treated? Treatment may include:  Antibiotic creams or pills.  Antifungal medicines.  Medicines to ease symptoms if you have a virus. Your sex partner should also be treated.  Estrogen medicines.  Avoiding scented soaps, sprays, or douches.  Stopping use of products that caused irritation and then using a cream to treat symptoms. Follow these instructions at home: Lifestyle  Keep the area around your vagina clean and dry. ? Avoid using soap. ? Rinse the area with water.  Until your doctor says it is okay: ? Do not use washes for the vagina. ? Do not use tampons. ? Do not have sex.  Wipe from front to  back after going to the bathroom.  When your doctor says it is okay, practice safe sex and use condoms. General instructions  Take over-the-counter and prescription medicines only as told by your doctor.  If you were prescribed an antibiotic medicine, take or use it as told by your doctor. Do not stop taking or using it even if you start to feel better.  Keep all follow-up visits. How is this prevented?  Do not use things that can irritate the vagina, such as fabric softeners. Avoid these products if they are scented: ? Sprays. ? Detergents. ? Tampons. ? Products for cleaning the vagina. ? Soaps or bubble baths.  Let air reach your vagina. To do this: ? Wear cotton underwear. ? Do not wear:  Underwear while you sleep.  Tight pants.  Thong underwear.  Underwear or nylons without a cotton panel. ? Take off any wet clothing, such as bathing suits, as soon as you can. ? Practice safe sex and use condoms. Contact a doctor if:  You have pain in your belly or in the area between your hips.  You have a fever or chills.  Your symptoms last for more than 2-3 days. Get help right away if:  You have a fever and your symptoms get worse all of a sudden. Summary  Vaginitis is irritation and swelling of the vagina.  Treatment will depend on the cause of the condition.  Do not use washes or tampons or have sex until your doctor says it   is okay. This information is not intended to replace advice given to you by your health care provider. Make sure you discuss any questions you have with your health care provider. Document Revised: 11/21/2019 Document Reviewed: 11/21/2019 Elsevier Patient Education  2021 [employer].  

## 2020-08-13 NOTE — Addendum Note (Signed)
Addended by: Roena Malady on: 08/13/2020 04:27 PM   Modules accepted: Orders

## 2020-08-14 LAB — WET PREP BY MOLECULAR PROBE
Candida species: DETECTED — AB
Gardnerella vaginalis: NOT DETECTED
MICRO NUMBER:: 11632288
SPECIMEN QUALITY:: ADEQUATE
Trichomonas vaginosis: NOT DETECTED

## 2020-08-18 ENCOUNTER — Telehealth: Payer: Self-pay

## 2020-08-18 MED ORDER — FLUCONAZOLE 150 MG PO TABS: 150.0000 mg | ORAL_TABLET | Freq: Once | ORAL | 0 refills | Status: AC

## 2020-08-18 NOTE — Telephone Encounter (Signed)
Will send in an additional dose of Diflucan. If no improvement, recommend she follow up with GYN.

## 2020-08-18 NOTE — Addendum Note (Signed)
Addended by: Lorre Munroe on: 08/18/2020 03:35 PM   Modules accepted: Orders

## 2020-08-18 NOTE — Telephone Encounter (Signed)
Pt said had appt on 08/13/20 with R Baity NP and was dx with yeast infection. Pt took the diflucan on 08/13/20; the vaginal discharge is gone but pt still has vaginal itching; no perineal itching. Pt cannot see any improvement with vaginal itching. Pt wants note sent to Pamala Hurry NP rather than scheduling a FU appt. [facility]. Pt request cb after Pamala Hurry NP reviews this note.

## 2020-09-29 ENCOUNTER — Other Ambulatory Visit: Payer: Self-pay

## 2020-09-29 ENCOUNTER — Ambulatory Visit (INDEPENDENT_AMBULATORY_CARE_PROVIDER_SITE_OTHER): Payer: 59 | Admitting: Dermatology

## 2020-09-29 DIAGNOSIS — L709 Acne, unspecified: Secondary | ICD-10-CM | POA: Diagnosis not present

## 2020-09-29 DIAGNOSIS — L669 Cicatricial alopecia, unspecified: Secondary | ICD-10-CM | POA: Diagnosis not present

## 2020-09-29 DIAGNOSIS — L219 Seborrheic dermatitis, unspecified: Secondary | ICD-10-CM | POA: Diagnosis not present

## 2020-09-29 MED ORDER — FLUOCINOLONE ACETONIDE BODY 0.01 % EX OIL: TOPICAL_OIL | CUTANEOUS | 5 refills | Status: DC

## 2020-09-29 MED ORDER — DOXYCYCLINE HYCLATE 100 MG PO CAPS: 100.0000 mg | ORAL_CAPSULE | Freq: Every day | ORAL | 2 refills | Status: AC

## 2020-09-29 MED ORDER — KETOCONAZOLE 2 % EX SHAM: 1.0000 "application " | MEDICATED_SHAMPOO | CUTANEOUS | 5 refills | Status: DC

## 2020-09-29 MED ORDER — FLUOCINONIDE 0.05 % EX SOLN
1.0000 | Freq: Every day | CUTANEOUS | 2 refills | Status: DC
Start: 2020-09-29 — End: 2021-06-22

## 2020-09-29 MED ORDER — ADAPALENE 0.3 % EX GEL: 1.0000 "application " | Freq: Every day | CUTANEOUS | 2 refills | Status: DC

## 2020-09-29 MED ORDER — TACROLIMUS 0.1 % EX OINT: TOPICAL_OINTMENT | CUTANEOUS | 2 refills | Status: DC

## 2020-09-29 NOTE — Progress Notes (Signed)
Follow-Up Visit   Subjective  Connie [name] is a 45 y.o. female who presents for the following: Alopecia (Patient here today for follow up on allopecia and seborrheic dermatitis. She is currently using rogaine qhs and states she has noticed hair growth on sides of scalp. She has not noticed any new growth in center of scalp.)  The following portions of the chart were reviewed this encounter and updated as appropriate:      Objective  Well appearing patient in no apparent distress; mood and affect are within normal limits.  A focused examination was performed including scalp, face . Relevant physical exam findings are noted in the Assessment and Plan.  Objective  Scalp: Scarring alopecia on crown, thinning frontal/temporal hairline, stable with slight improvement noted when compared to baseline photos.  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  Start Tacrolimus 0.1 % ointment - Apply to affected areas of hairline 1 - 2 times daily.   Start Doxycycline 100 mg capsule - take 1 capsule by mouth daily with food.   Topical steroids (such as triamcinolone, fluocinolone, fluocinonide, mometasone, clobetasol, halobetasol, betamethasone, hydrocortisone) can cause thinning and lightening of the skin if they are used for too long in the same area. Your physician has selected the right strength medicine for your problem and area affected on the body. Please use your medication only as  directed by your physician to prevent side effects.  Avoid applying to face, groin, and axilla. Use as directed. Risk of skin atrophy with long-term use reviewed.   Doxycycline should be taken with food to prevent nausea. Do not lay down for 30 minutes after taking. Be cautious with sun exposure and use good sun protection while on this medication. Pregnant women should not take this medication.     fluocinonide (LIDEX) 0.05 % external solution - Scalp  tacrolimus (PROTOPIC) 0.1 % ointment - Scalp  doxycycline (VIBRAMYCIN) 100 MG capsule - Scalp  Seborrheic dermatitis Scalp  Controlled  Continue ketoconazole shampoo 2x/week and Dermasmoothe oil qd/bid  Seborrheic Dermatitis  -  is a chronic persistent rash characterized by pinkness and scaling most commonly of the mid face but also can occur on the scalp (dandruff), ears; mid chest and mid back. It tends to be exacerbated by stress and cooler weather.  People who have neurologic disease may experience new onset or exacerbation of existing seborrheic dermatitis.  The condition is not curable but treatable and can be controlled.   Topical steroids (such as triamcinolone, fluocinolone, fluocinonide, mometasone, clobetasol, halobetasol, betamethasone, hydrocortisone) can cause thinning and lightening of the skin if they are used for too long in the same area. Your physician has selected the right strength medicine for your problem and area affected on the body. Please use your medication only as directed by your physician to prevent side effects.  Avoid applying to face, groin, and axilla. Use as directed. Risk of skin atrophy with long-term use reviewed.    Reordered Medications Fluocinolone Acetonide  Body 0.01 % OIL ketoconazole (NIZORAL) 2 % shampoo  Acne, unspecified acne type forehead and temples  Start Adapalene 0.3 % gel - apply 1 application daily at bedtime to affected areas of face.    Topical retinoid medications like  tretinoin/Retin-A, adapalene/Differin, tazarotene/Fabior, and Epiduo/Epiduo Forte can cause dryness and irritation when first started. Only apply a pea-sized amount to the entire affected area. Avoid applying it around the eyes, edges of mouth and creases at the nose. If you experience irritation, use a good moisturizer first and/or apply the medicine less often. If you are doing well with the medicine, you can increase how often you use it until you are applying every night. Be careful with sun protection while using this medication as it can make you sensitive to the sun. This medicine should not be used by pregnant women.    Adapalene (DIFFERIN) 0.3 % gel - forehead and temples  Return in about 2 months (around 11/29/2020) for alopecia, CCCA .  I, Asher Muir, CMA, am acting as scribe for Connie Niece, MD  Documentation: I have reviewed the above documentation for accuracy and completeness, and I agree with the above.  Connie Niece MD

## 2020-09-29 NOTE — Patient Instructions (Addendum)
Doxycycline should be taken with food to prevent nausea. Do not lay down for 30 minutes after taking. Be cautious with sun exposure and use good sun protection while on this medication. Pregnant women should not take this medication.   Topical steroids (such as triamcinolone, fluocinolone, fluocinonide, mometasone, clobetasol, halobetasol, betamethasone, hydrocortisone) can cause thinning and lightening of the skin if they are used for too long in the same area. Your physician has selected the right strength medicine for your problem and area affected on the body. Please use your medication only as directed by your physician to prevent side effects.  Avoid applying to face, groin, and axilla. Use as directed. Risk of skin atrophy with long-term use reviewed.   Topical retinoid medications like tretinoin/Retin-A, adapalene/Differin, tazarotene/Fabior, and Epiduo/Epiduo Forte can cause dryness and irritation when first started. Only apply a pea-sized amount to the entire affected area. Avoid applying it around the eyes, edges of mouth and creases at the nose. If you experience irritation, use a good moisturizer first and/or apply the medicine less often. If you are doing well with the medicine, you can increase how often you use it until you are applying every night. Be careful with sun protection while using this medication as it can make you sensitive to the sun. This medicine should not be used by pregnant women.         If you have any questions or concerns for your doctor, please call our main line at [phone] and press option 4 to reach your doctor's medical assistant. If no one answers, please leave a voicemail as directed and we will return your call as soon as possible. Messages left after 4 pm will be answered the following business day.   You may also send [place] a message via MyChart. We typically respond to MyChart messages within 1-2 business days.  For prescription refills, please ask  your pharmacy to contact our office. Our fax number is [phone].  If you have an urgent issue when the clinic is closed that cannot wait until the next business day, you can page your doctor at the number below.    Please note that while we do our best to be available for urgent issues outside of office hours, we are not available 24/7.   If you have an urgent issue and are unable to reach [place], you may choose to seek medical care at your doctor's office, retail clinic, urgent care center, or emergency room.  If you have a medical emergency, please immediately call 911 or go to the emergency department.  Pager Numbers  - Dr. Gwen Pounds: [phone]  - Dr. Neale Burly: [phone]  - Dr. Roseanne Reno: [phone]  In the event of inclement weather, please call our main line at [phone] for an update on the status of any delays or closures.  Dermatology Medication Tips: Please keep the boxes that topical medications come in in order to help keep track of the instructions about where and how to use these. Pharmacies typically print the medication instructions only on the boxes and not directly on the medication tubes.   If your medication is too expensive, please contact our office at [phone] option 4 or send [place] a message through MyChart.   We are unable to tell what your co-pay for medications will be in advance as this is different depending on your insurance coverage. However, we may be able to find a substitute medication at lower cost or fill out paperwork to get insurance to cover  a needed medication.   If a prior authorization is required to get your medication covered by your insurance company, please allow [place] 1-2 business days to complete this process.  Drug prices often vary depending on where the prescription is filled and some pharmacies may offer cheaper prices.  The website www.goodrx.com contains coupons for medications through different pharmacies. The prices here do not  account for what the cost may be with help from insurance (it may be cheaper with your insurance), but the website can give you the price if you did not use any insurance.  - You can print the associated coupon and take it with your prescription to the pharmacy.  - You may also stop by our office during regular business hours and pick up a GoodRx coupon card.  - If you need your prescription sent electronically to a different pharmacy, notify our office through [facility] or by phone at [phone] option 4.

## 2020-10-08 ENCOUNTER — Telehealth: Payer: Self-pay

## 2020-10-08 NOTE — Telephone Encounter (Signed)
Pt reports she is staying at [facility]

## 2020-11-30 ENCOUNTER — Other Ambulatory Visit: Payer: Self-pay

## 2020-11-30 ENCOUNTER — Ambulatory Visit (INDEPENDENT_AMBULATORY_CARE_PROVIDER_SITE_OTHER): Payer: 59 | Admitting: Dermatology

## 2020-11-30 DIAGNOSIS — L668 Other cicatricial alopecia: Secondary | ICD-10-CM | POA: Diagnosis not present

## 2020-11-30 DIAGNOSIS — L669 Cicatricial alopecia, unspecified: Secondary | ICD-10-CM

## 2020-11-30 NOTE — Progress Notes (Signed)
   Follow-Up Visit   Subjective  Connie [name] is a 45 y.o. female who presents for the following: Follow-up (Patient here today for 2 month CCCA follow up. Patient is using Rogaine 5%, fluocinonide solution and taking doxycycline 100mg  daily. Patient feels tjat she has had a little improvement. ).  Patient was unable to get tacrolimus due to cost.   The following portions of the chart were reviewed this encounter and updated as appropriate:        Review of Systems:  No other skin or systemic complaints except as noted in HPI or Assessment and Plan.  Objective  Well appearing patient in no apparent distress; mood and affect are within normal limits.  A focused examination was performed including scalp. Relevant physical exam findings are noted in the Assessment and Plan.  Scalp Hair thinning frontal, temporal and crown with hair regrowth noted when compared to baseline photos      Assessment & Plan  Central centrifugal scarring alopecia Scalp  Chronic condition, improving but not at goal, discussed that because there is scarring not all hair will regrow, especially  at crown  Continue Rogaine 5% foam qd Continue fluocinonide 0.05% solution qhs. Avoid applying to face, groin, and axilla. Use as directed. Risk of skin atrophy with long-term use reviewed.  Continue doxycycline 100mg  1 po qd with food.  Continue avoidence of chemical/heat treatments to hair  Doxycycline should be taken with food to prevent nausea. Do not lay down for 30 minutes after taking. Be cautious with sun exposure and use good sun protection while on this medication. Pregnant women should not take this medication.   Topical steroids (such as triamcinolone, fluocinolone, fluocinonide, mometasone, clobetasol, halobetasol, betamethasone, hydrocortisone) can cause thinning and lightening of the skin if they are used for too long in the same area. Your physician has selected the right strength medicine for your  problem and area affected on the body. Please use your medication only as directed by your physician to prevent side effects.    Related Medications fluocinonide (LIDEX) 0.05 % external solution Apply 1 application topically daily. To affected areas of scalp Avoid applying to face, groin, and axilla. Use as directed.  Return in about 6 months (around 06/01/2021) for CCCA.  , RMA, am acting as scribe for 06/03/2021, MD .  Documentation: I have reviewed the above documentation for accuracy and completeness, and I agree with the above.  Anise Salvo MD

## 2020-11-30 NOTE — Patient Instructions (Addendum)
Continue Rogaine 5% foam daily Continue fluocinonide 0.05% solution at bedtime. Avoid applying to face, groin, and axilla. Use as directed. Risk of skin atrophy with long-term use reviewed.  Continue doxycycline 100mg  1 po qd with food.   Doxycycline should be taken with food to prevent nausea. Do not lay down for 30 minutes after taking. Be cautious with sun exposure and use good sun protection while on this medication. Pregnant women should not take this medication.   Topical steroids (such as triamcinolone, fluocinolone, fluocinonide, mometasone, clobetasol, halobetasol, betamethasone, hydrocortisone) can cause thinning and lightening of the skin if they are used for too long in the same area. Your physician has selected the right strength medicine for your problem and area affected on the body. Please use your medication only as directed by your physician to prevent side effects.   If you have any questions or concerns for your doctor, please call our main line at [phone] and press option 4 to reach your doctor's medical assistant. If no one answers, please leave a voicemail as directed and we will return your call as soon as possible. Messages left after 4 pm will be answered the following business day.   You may also send [phone] a message via MyChart. We typically respond to MyChart messages within 1-2 business days.  For prescription refills, please ask your pharmacy to contact our office. Our fax number is [phone].  If you have an urgent issue when the clinic is closed that cannot wait until the next business day, you can page your doctor at the number below.    Please note that while we do our best to be available for urgent issues outside of office hours, we are not available 24/7.   If you have an urgent issue and are unable to reach [phone], you may choose to seek medical care at your doctor's office, retail clinic, urgent care center, or emergency room.  If you have a medical  emergency, please immediately call 911 or go to the emergency department.  Pager Numbers  - Dr. [place]: [phone]  - Dr. [phone]: [phone]  - Dr. [phone]: [phone]  In the event of inclement weather, please call our main line at [phone] for an update on the status of any delays or closures.  Dermatology Medication Tips: Please keep the boxes that topical medications come in in order to help keep track of the instructions about where and how to use these. Pharmacies typically print the medication instructions only on the boxes and not directly on the medication tubes.   If your medication is too expensive, please contact our office at [phone] option 4 or send [phone] a message through MyChart.   We are unable to tell what your co-pay for medications will be in advance as this is different depending on your insurance coverage. However, we may be able to find a substitute medication at lower cost or fill out paperwork to get insurance to cover a needed medication.   If a prior authorization is required to get your medication covered by your insurance company, please allow [place] 1-2 business days to complete this process.  Drug prices often vary depending on where the prescription is filled and some pharmacies may offer cheaper prices.  The website www.goodrx.com contains coupons for medications through different pharmacies. The prices here do not account for what the cost may be with help from insurance (it may be cheaper with your insurance), but the website can give you the price if you  did not use any insurance.  - You can print the associated coupon and take it with your prescription to the pharmacy.  - You may also stop by our office during regular business hours and pick up a GoodRx coupon card.  - If you need your prescription sent electronically to a different pharmacy, notify our office through [facility] or by phone at [phone] option 4.

## 2020-12-08 IMAGING — MG DIGITAL SCREENING BILAT W/ TOMO W/ CAD
6 of 10 series · 6 of 30 positions shown · non-contrast
Comparison: Previous exam(s).

CLINICAL DATA: Screening.

EXAM:
DIGITAL SCREENING BILATERAL MAMMOGRAM WITH TOMO AND CAD

[R MLO synth-2D (1 of 2)]
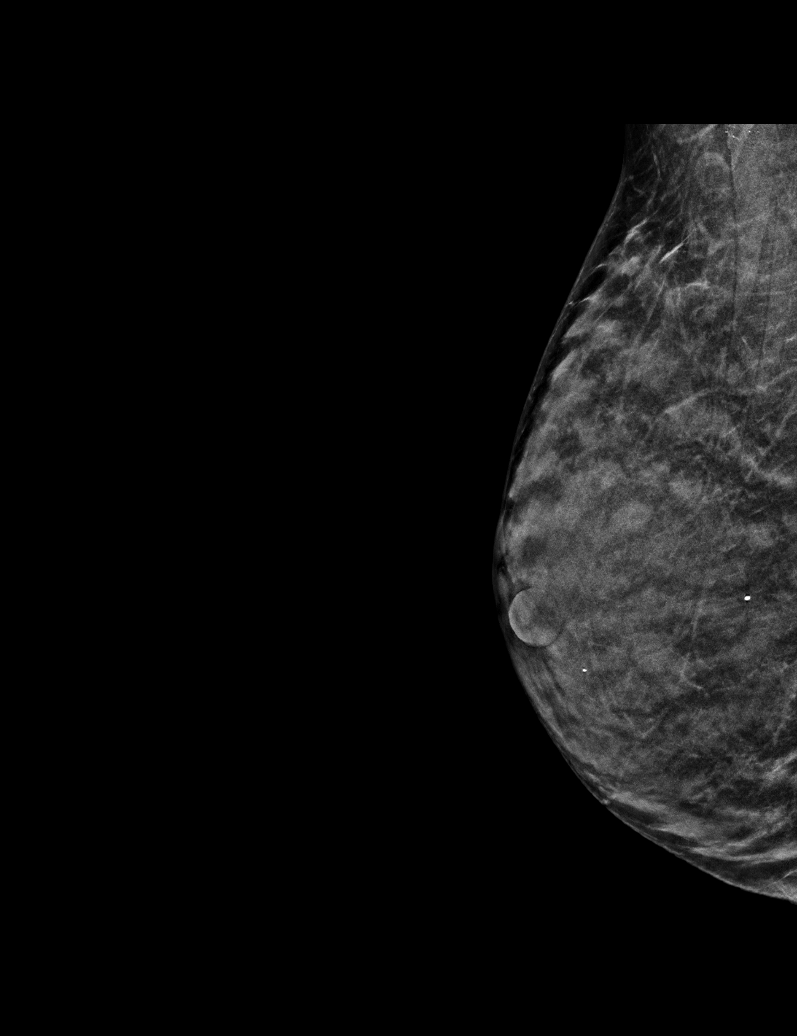

[L MLO synth-2D]
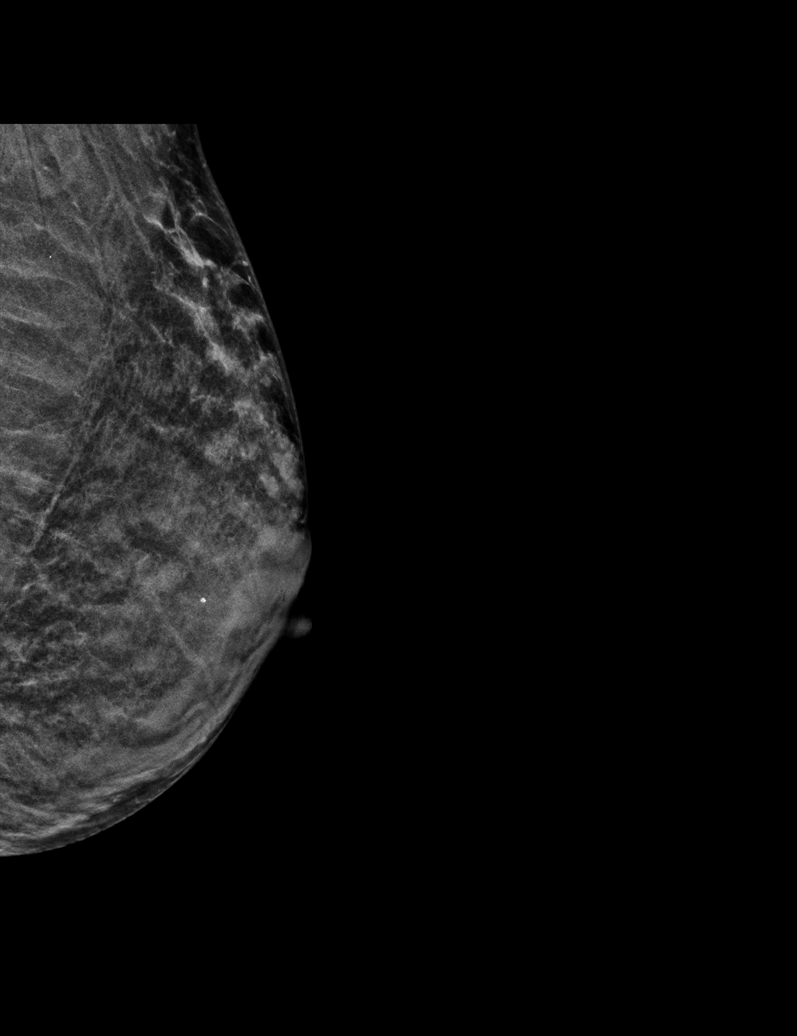

[R MLO synth-2D (2 of 2)]
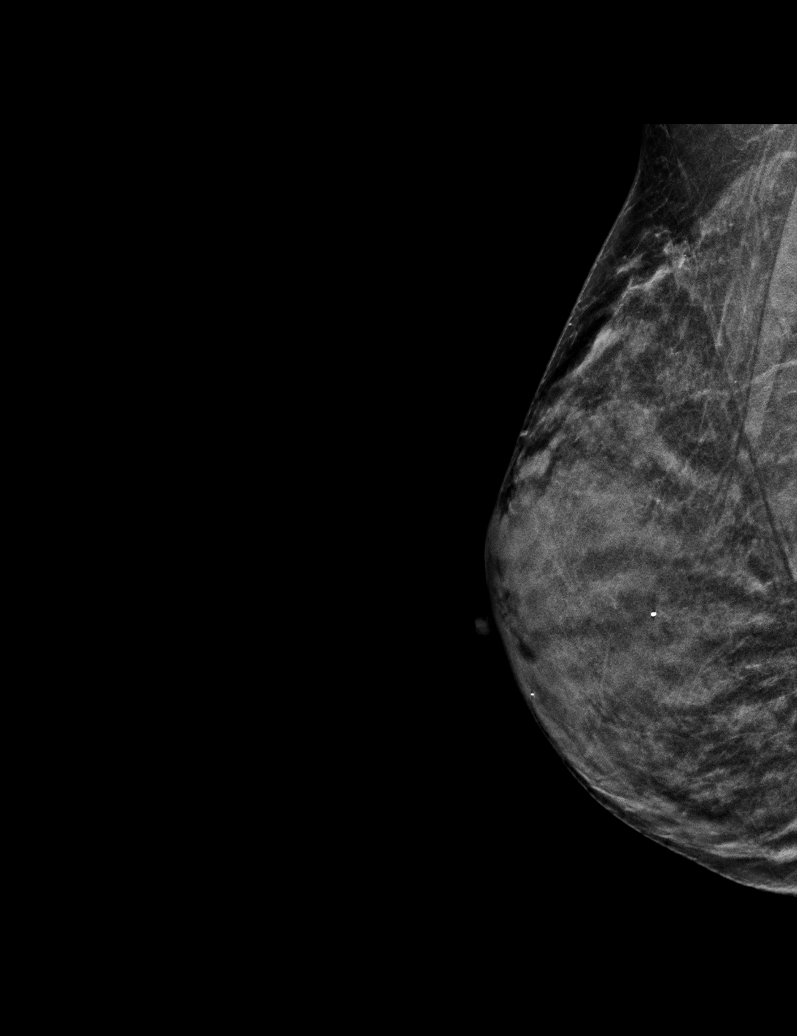

[R CC synth-2D]
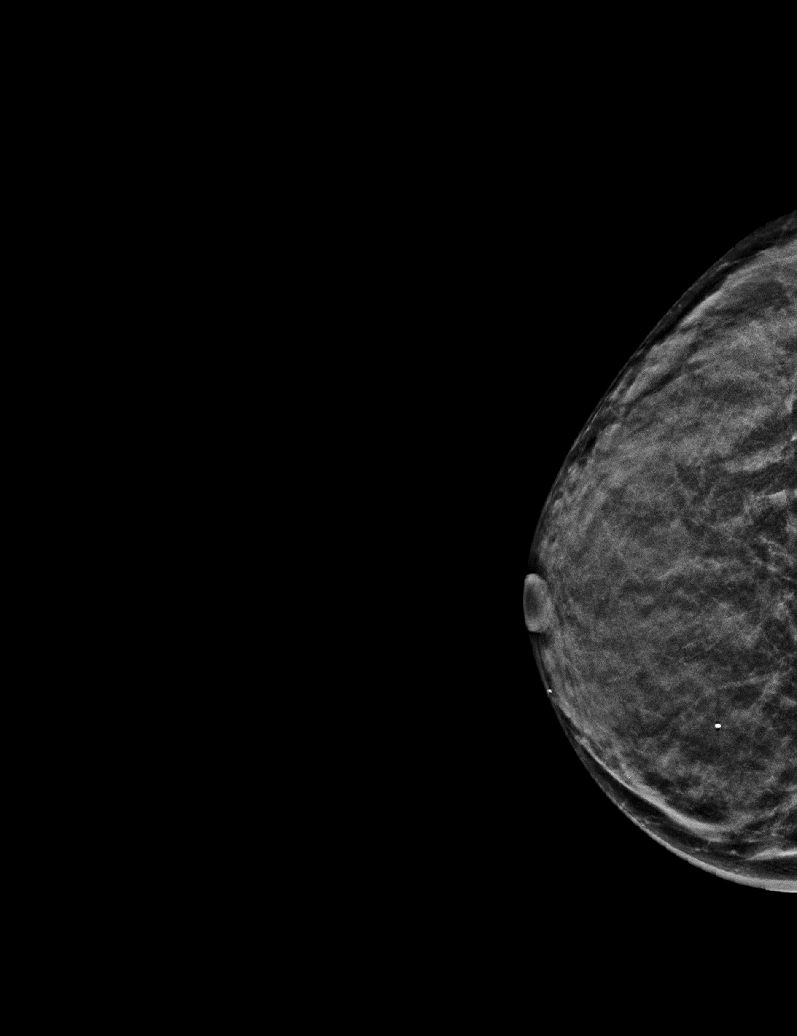

[L CC synth-2D]
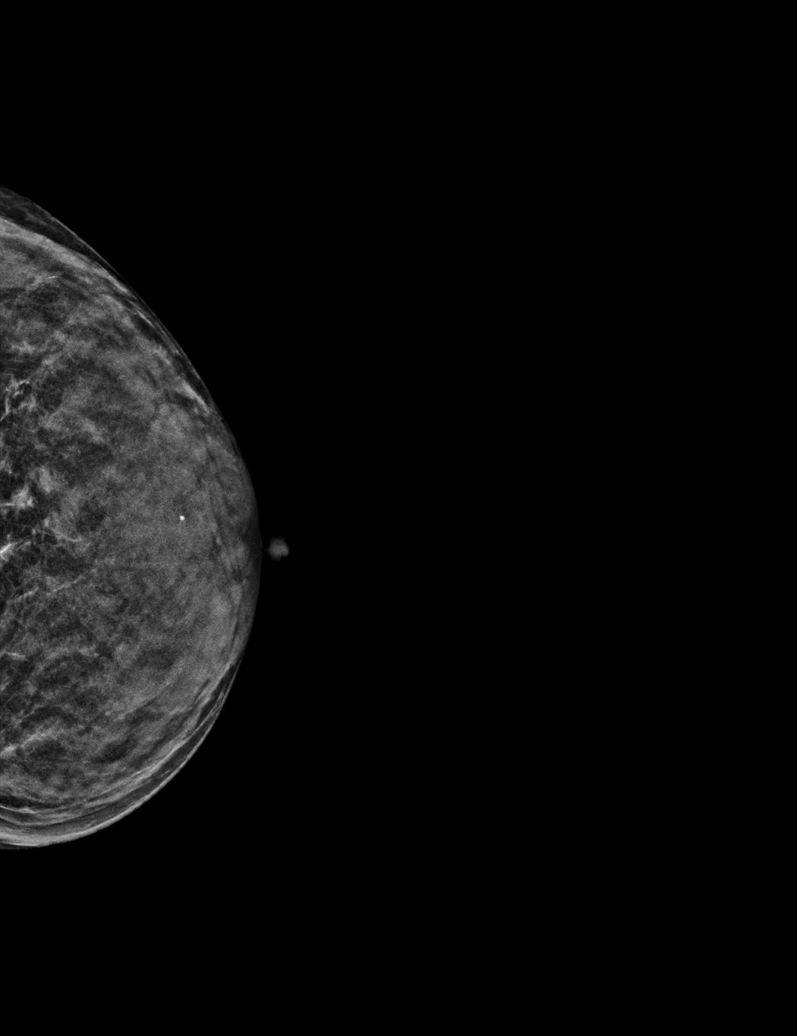

[R MLO tomo · tomo slice 20/39.0]
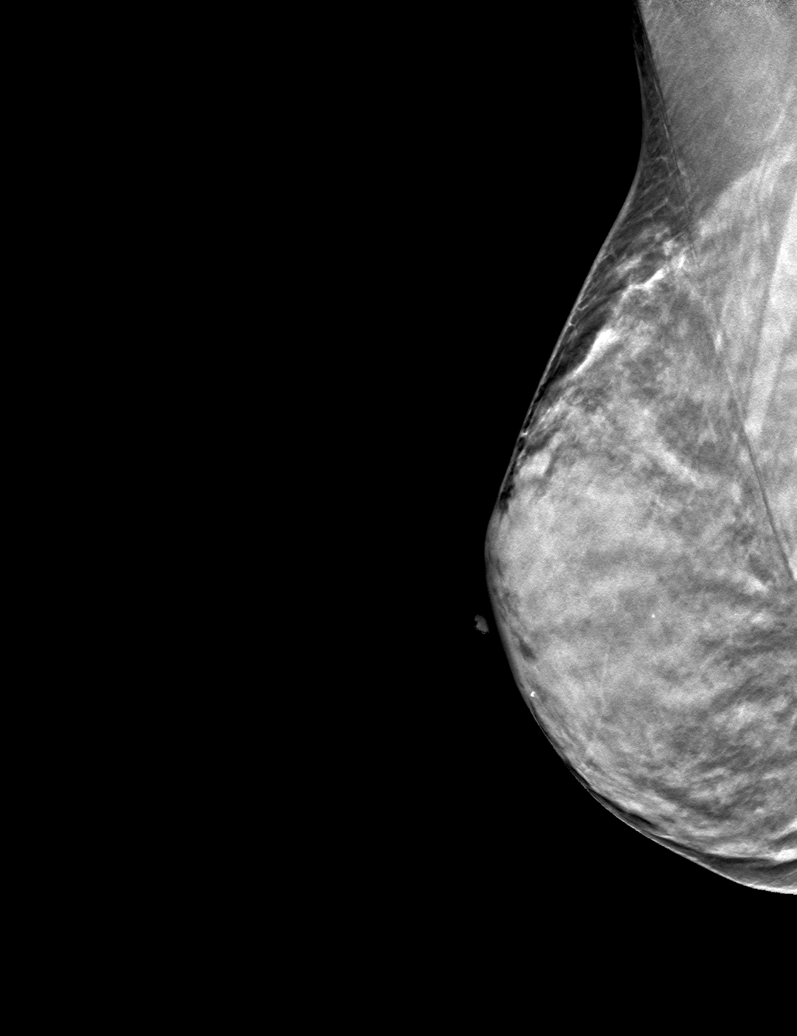

[6 of 30 positions shown; findings below may reference images not displayed]

ACR Breast Density Category d: The breast tissue is extremely dense,
which lowers the sensitivity of mammography
FINDINGS: There are no findings suspicious for malignancy. Images were
processed with CAD.
IMPRESSION: No mammographic evidence of malignancy. A result letter of this
screening mammogram will be mailed directly to the patient.

RECOMMENDATION:
Screening mammogram in one year. (Code:WO-[id number])

BI-RADS CATEGORY  1: Negative.

## 2021-01-08 ENCOUNTER — Encounter: Payer: Self-pay | Admitting: Family Medicine

## 2021-01-08 ENCOUNTER — Other Ambulatory Visit: Payer: Self-pay

## 2021-01-08 ENCOUNTER — Ambulatory Visit (INDEPENDENT_AMBULATORY_CARE_PROVIDER_SITE_OTHER): Payer: 59 | Admitting: Family Medicine

## 2021-01-08 VITALS — BP 116/64 | HR 60 | Temp 97.9°F | Ht 63.5 in | Wt 114.3 lb

## 2021-01-08 DIAGNOSIS — N898 Other specified noninflammatory disorders of vagina: Secondary | ICD-10-CM | POA: Diagnosis not present

## 2021-01-08 DIAGNOSIS — N760 Acute vaginitis: Secondary | ICD-10-CM

## 2021-01-08 DIAGNOSIS — Z113 Encounter for screening for infections with a predominantly sexual mode of transmission: Secondary | ICD-10-CM | POA: Diagnosis not present

## 2021-01-08 DIAGNOSIS — Z3042 Encounter for surveillance of injectable contraceptive: Secondary | ICD-10-CM | POA: Insufficient documentation

## 2021-01-08 LAB — POCT WET PREP (WET MOUNT): Trichomonas Wet Prep HPF POC: ABSENT

## 2021-01-08 MED ORDER — METRONIDAZOLE 500 MG PO TABS: 500.0000 mg | ORAL_TABLET | Freq: Two times a day (BID) | ORAL | 0 refills | Status: AC

## 2021-01-08 NOTE — Assessment & Plan Note (Addendum)
Pt desires re start on depo  utd pap  Understands this does not protect from std  upreg today and in a week  If neg-start depo in a week

## 2021-01-08 NOTE — Patient Instructions (Addendum)
The wet prep today shows bacterial vaginosis   Take the generic flagyl as directed  STD screening tests are pending   Urine pregnancy test today  We can repeat that in a week and start depo provera

## 2021-01-08 NOTE — Progress Notes (Signed)
Subjective:    Patient ID: Connie [name], female    DOB: 1975-07-06, 45 y.o.   MRN: [id number]  This visit occurred during the SARS-CoV-2 public health emergency.  Safety protocols were in place, including screening questions prior to the visit, additional usage of staff PPE, and extensive cleaning of exam room while observing appropriate contact time as indicated for disinfecting solutions.   HPI 45 yo pt of NP Baity presents for c/o vaginal discharge   Wt Readings from Last 3 Encounters:  01/08/21 114 lb 5 oz (51.9 kg)  08/13/20 121 lb (54.9 kg)  08/07/20 121 lb (54.9 kg)   19.93 kg/m  Was sexually active last week  Used cocoa butter- is sensitive to that  Some itching and an odor  Discharge- not thick, no color (is white)  No lesions or swelling  No cramping or pain   Has regular periods  No contraception  Just finished period on 8/3 (2 d ago)   Pap nl in 3/21  Menses are regular  Not heavy  Last about 4 days   No HTN  No h/o blood clots  Wants to go back on depo     Could be yeast  Wants screening for STD    Pt has a h/o recurrent vaginitis in the past  Had yeast infx in march   Wet prep today Results for orders placed or performed in visit on 01/08/21  POCT Wet Prep [employer] [facility])  Result Value Ref Range   Source Wet Prep POC vaginal    WBC, Wet Prep HPF POC few    Bacteria Wet Prep HPF POC Few Few   BACTERIA WET PREP MORPHOLOGY POC     Clue Cells Wet Prep HPF POC Many (A) None   Clue Cells Wet Prep Whiff POC     Yeast Wet Prep HPF POC None None   KOH Wet Prep POC None None   Trichomonas Wet Prep HPF POC Absent Absent      Patient Active Problem List   Diagnosis Date Noted   Vaginal discharge 01/08/2021   Screen for STD (sexually transmitted disease) 01/08/2021   Depot contraception 01/08/2021   Recurrent vaginitis 08/14/2019   Past Medical History:  Diagnosis Date   Acne    Dermatitis    Past Surgical History:  Procedure Laterality Date    CESAREAN SECTION     Social History   Tobacco Use   Smoking status: Never   Smokeless tobacco: Never  Substance Use Topics   Alcohol use: Yes    Comment: occasional   Drug use: Never   Family History  Problem Relation Age of Onset   Breast cancer Maternal Grandmother    Thyroid cancer Mother    Hypertension Father    Allergies  Allergen Reactions   Latex Rash    Also causes itching   Current Outpatient Medications on File Prior to Visit  Medication Sig Dispense Refill   Adapalene (DIFFERIN) 0.3 % gel Apply 1 application topically at bedtime. Apply to affected areas of face 45 g 2   Ascorbic Acid (VITAMIN C PO) Take by mouth daily.     Cholecalciferol (VITAMIN D3 PO) Take by mouth daily.     Fluocinolone Acetonide Body 0.01 % OIL Apply to scalp 2 nights a week cover with a cap, shampoo in the morning 120 mL 5   fluocinonide (LIDEX) 0.05 % external solution Apply 1 application topically daily. To affected areas of scalp Avoid applying to face, groin,  and axilla. Use as directed. 180 mL 2   fluticasone (FLONASE) 50 MCG/ACT nasal spray Place 1-2 sprays into both nostrils daily. 16 g 0   ibuprofen (ADVIL) 800 MG tablet Take 1 tablet (800 mg total) by mouth 3 (three) times daily. 21 tablet 0   ketoconazole (NIZORAL) 2 % shampoo Apply 1 application topically 2 (two) times a week. 120 mL 5   Multiple Vitamin (MULTIVITAMIN ADULT PO) Take by mouth daily.     No current facility-administered medications on file prior to visit.    Review of Systems  Constitutional:  Negative for activity change, appetite change, fatigue, fever and unexpected weight change.  HENT:  Negative for congestion, ear pain, rhinorrhea, sinus pressure and sore throat.   Eyes:  Negative for pain, redness and visual disturbance.  Respiratory:  Negative for cough, shortness of breath and wheezing.   Cardiovascular:  Negative for chest pain and palpitations.  Gastrointestinal:  Negative for abdominal pain, blood  in stool, constipation and diarrhea.  Endocrine: Negative for polydipsia and polyuria.  Genitourinary:  Positive for vaginal discharge. Negative for dysuria, frequency and urgency.       Vaginal itching-mild  Some odor  Musculoskeletal:  Negative for arthralgias, back pain and myalgias.  Skin:  Negative for pallor and rash.  Allergic/Immunologic: Negative for environmental allergies.  Neurological:  Negative for dizziness, syncope and headaches.  Hematological:  Negative for adenopathy. Does not bruise/bleed easily.  Psychiatric/Behavioral:  Negative for decreased concentration and dysphoric [name]. The patient is not nervous/anxious.       Objective:   Physical Exam Constitutional:      General: She is not in acute distress.    Appearance: Normal appearance. She is normal weight. She is not ill-appearing.  Eyes:     General:        Right eye: No discharge.        Left eye: No discharge.     Conjunctiva/sclera: Conjunctivae normal.     Pupils: Pupils are equal, round, and reactive to light.  Cardiovascular:     Rate and Rhythm: Normal rate and regular rhythm.  Abdominal:     General: Abdomen is flat. Bowel sounds are normal. There is no distension.     Palpations: Abdomen is soft. There is no mass.     Tenderness: There is no abdominal tenderness. There is no right CVA tenderness or left CVA tenderness.     Comments: No suprapubic tenderness or fullness    Genitourinary:    Comments: Nl ext genitalia No lesions  No excessive discharge  No bleeding  No tenderness  Wet prep obt- with clue cells Musculoskeletal:     Cervical back: Normal range of motion.  Lymphadenopathy:     Cervical: No cervical adenopathy.  Skin:    Coloration: Skin is not pale.     Findings: No rash.  Neurological:     Mental Status: She is alert.  Psychiatric:        [name] and Affect: [name] normal.          Assessment & Plan:   Problem List Items Addressed This Visit       Genitourinary    Recurrent vaginitis    This episode occurred after sexual activity using cocoa butter  Reviewed records today-in past has had yeast  Has seen gyn in the past Clue cells on wet prep-tx with flagyl Std screen done          Other   Vaginal discharge - Primary  Treating for BV with clue cells on wet prep Flagyl 500 mg bid for 7d Update if not starting to improve in a week or if worsening  Std screen ordered       Relevant Orders   C. trachomatis/N. gonorrhoeae RNA   POCT Wet Prep [employer] [facility]) (Completed)   Screen for STD (sexually transmitted disease)    Gc/chlamydia, rpr and HIV tests today  Unprotected intercourse recent  No known exp Has some vaginal d/c with clue cells on wet prep but no trich       Relevant Orders   C. trachomatis/N. gonorrhoeae RNA   HIV Antibody (routine testing w rflx)   Hepatitis C antibody   RPR   Depot contraception    Pt desires re start on depo  utd pap  Understands this does not protect from std  upreg today and in a week  If neg-start depo in a week

## 2021-01-10 NOTE — Assessment & Plan Note (Signed)
Gc/chlamydia, rpr and HIV tests today  Unprotected intercourse recent  No known exp Has some vaginal d/c with clue cells on wet prep but no trich

## 2021-01-10 NOTE — Assessment & Plan Note (Signed)
Treating for BV with clue cells on wet prep Flagyl 500 mg bid for 7d Update if not starting to improve in a week or if worsening  Std screen ordered

## 2021-01-10 NOTE — Assessment & Plan Note (Addendum)
This episode occurred after sexual activity using cocoa butter  Reviewed records today-in past has had yeast  Has seen gyn in the past Clue cells on wet prep-tx with flagyl Std screen done

## 2021-01-12 LAB — RPR: RPR Ser Ql: NONREACTIVE

## 2021-01-12 LAB — HEPATITIS C ANTIBODY
Hepatitis C Ab: NONREACTIVE
SIGNAL TO CUT-OFF: 0 (ref <1.00)

## 2021-01-12 LAB — C. TRACHOMATIS/N. GONORRHOEAE RNA
C. trachomatis RNA, TMA: NOT DETECTED
N. gonorrhoeae RNA, TMA: NOT DETECTED

## 2021-01-12 LAB — HIV ANTIBODY (ROUTINE TESTING W REFLEX): HIV 1&2 Ab, 4th Generation: NONREACTIVE

## 2021-01-13 ENCOUNTER — Other Ambulatory Visit: Payer: Self-pay

## 2021-01-13 ENCOUNTER — Ambulatory Visit (INDEPENDENT_AMBULATORY_CARE_PROVIDER_SITE_OTHER): Payer: 59

## 2021-01-13 DIAGNOSIS — Z3042 Encounter for surveillance of injectable contraceptive: Secondary | ICD-10-CM

## 2021-01-13 LAB — POCT URINE PREGNANCY: Preg Test, Ur: NEGATIVE

## 2021-01-13 MED ORDER — MEDROXYPROGESTERONE ACETATE 150 MG/ML IM SUSP
150.0000 mg | Freq: Once | INTRAMUSCULAR | Status: AC
  Administered 2021-01-13: 150 mg via INTRAMUSCULAR

## 2021-01-13 NOTE — Progress Notes (Signed)
Patient in office to re start depo injections. Second urine pregnancy test was done today results were negative. Patient received depo in left upper outer quadrant. Patient will make next nurse visit at check out. Informed needs to be 10-26 to 11-9.

## 2021-04-14 ENCOUNTER — Other Ambulatory Visit: Payer: Self-pay

## 2021-04-14 ENCOUNTER — Ambulatory Visit (INDEPENDENT_AMBULATORY_CARE_PROVIDER_SITE_OTHER): Payer: 59

## 2021-04-14 DIAGNOSIS — Z3042 Encounter for surveillance of injectable contraceptive: Secondary | ICD-10-CM | POA: Diagnosis not present

## 2021-04-14 MED ORDER — MEDROXYPROGESTERONE ACETATE 150 MG/ML IM SUSP
150.0000 mg | Freq: Once | INTRAMUSCULAR | Status: AC
  Administered 2021-04-14: 16:00:00 150 mg via INTRAMUSCULAR

## 2021-04-14 NOTE — Progress Notes (Signed)
Per orders of Dr. Patsy Lager, injection of Depo-Provera given by Erby Pian. Patient tolerated injection well. Next shot should be administered between 06/30/21 and 07/28/21. Pt given dates.

## 2021-05-13 ENCOUNTER — Encounter: Payer: Self-pay | Admitting: Family

## 2021-05-13 ENCOUNTER — Other Ambulatory Visit: Payer: Self-pay

## 2021-05-13 ENCOUNTER — Ambulatory Visit (INDEPENDENT_AMBULATORY_CARE_PROVIDER_SITE_OTHER): Payer: 59 | Admitting: Family

## 2021-05-13 VITALS — BP 108/80 | HR 74 | Temp 97.6°F | Ht 63.5 in | Wt 126.0 lb

## 2021-05-13 DIAGNOSIS — Z3042 Encounter for surveillance of injectable contraceptive: Secondary | ICD-10-CM

## 2021-05-13 DIAGNOSIS — N760 Acute vaginitis: Secondary | ICD-10-CM

## 2021-05-13 DIAGNOSIS — R519 Headache, unspecified: Secondary | ICD-10-CM | POA: Insufficient documentation

## 2021-05-13 DIAGNOSIS — L630 Alopecia (capitis) totalis: Secondary | ICD-10-CM | POA: Diagnosis not present

## 2021-05-13 LAB — TSH: TSH: 1.07 u[IU]/mL (ref 0.35–5.50)

## 2021-05-13 LAB — SEDIMENTATION RATE: Sed Rate: 1 mm/hr (ref 0–20)

## 2021-05-13 LAB — C-REACTIVE PROTEIN: CRP: <1 mg/dL (ref 0.5–20.0)

## 2021-05-13 NOTE — Assessment & Plan Note (Signed)
Cont f/u as scheduled with derm and medication prescribed prn

## 2021-05-13 NOTE — Assessment & Plan Note (Signed)
F/u q70m as scheduled

## 2021-05-13 NOTE — Assessment & Plan Note (Signed)
Recommended excedrin migraine, increase water intake, lab work today pending results. Instructed to wear glasses daily to see if headache improvement. If all neg will consider ref to neuro

## 2021-05-13 NOTE — Assessment & Plan Note (Signed)
F/u prn symptoms occur

## 2021-05-13 NOTE — Patient Instructions (Signed)
Suspected the headache is from lack of wearing eyeglasses daily as prescribed, please start wearing them daily and follow up with your eye doctor for your scheduled exam.   Stop by the lab prior to leaving today. I will notify you of your results once received.   It was a pleasure seeing you today! Please do not hesitate to reach out with any questions and or concerns.  Regards,   Mort Sawyers FNP-C

## 2021-05-13 NOTE — Progress Notes (Signed)
Established Patient Office Visit  Subjective:  Patient ID: Connie [name], female    DOB: May 17, 1976  Age: 45 y.o. MRN: [id number]  CC:  Chief Complaint  Patient presents with   Transitions Of Care    HPI Connie [name] is here today for a transition of care visit.  Patient was previously seeing Nicki Reaper FNP.   Pt is with acute concern: Feeling a constant dull , sometimes sharp, headache right upper frontal area that is intermittent over the last one week. This is not daily, but maybe every other day. She takes ibuprofen and tylenol without any relief of the symptoms. Last eye exam was Jan 2022, she has a f/u scheduled in January. She does wear glasses, but hasn't worn them everyday as she should.   Denies eye pain, and no eye discharge. Denies blurry vision.   Chronic problems addressed today:  Contraception: pt restarted depo injections for prevention of pregnancy, tolerating well.   Recurrent vaginitis: last episode was in august, and she states has vaginitis about 2-3 times a year, other than that doing ok. No vaginal discharge at present.  Alopecia: stable, sees derm Q6M  Past Medical History:  Diagnosis Date   Acne    Dermatitis     Past Surgical History:  Procedure Laterality Date   CESAREAN SECTION      Family History  Problem Relation Age of Onset   Breast cancer Maternal Grandmother    Thyroid cancer Mother    Hypertension Father     Social History   Socioeconomic History   Marital status: Single    Spouse name: Not on file   Number of children: Not on file   Years of education: Not on file   Highest education level: Not on file  Occupational History   Occupation: [employer], works with kids  Tobacco Use   Smoking status: Never   Smokeless tobacco: Never  Vaping Use   Vaping Use: Never used  Substance and Sexual Activity   Alcohol use: Yes    Alcohol/week: 7.0 standard drinks    Types: 7 Glasses of wine per week    Comment: occasional    Drug use: Never   Sexual activity: Not Currently    Partners: Male    Birth control/protection: Condom  Other Topics Concern   Not on file  Social History Narrative   Not on file   Social Determinants of Health   Financial Resource Strain: Not on file  Food Insecurity: Not on file  Transportation Needs: Not on file  Physical Activity: Not on file  Stress: Not on file  Social Connections: Not on file  Intimate Partner Violence: Not on file    Outpatient Medications Prior to Visit  Medication Sig Dispense Refill   Adapalene (DIFFERIN) 0.3 % gel Apply 1 application topically at bedtime. Apply to affected areas of face 45 g 2   Ascorbic Acid (VITAMIN C PO) Take by mouth daily.     fluocinonide (LIDEX) 0.05 % external solution Apply 1 application topically daily. To affected areas of scalp Avoid applying to face, groin, and axilla. Use as directed. 180 mL 2   ketoconazole (NIZORAL) 2 % shampoo Apply 1 application topically 2 (two) times a week. 120 mL 5   Multiple Vitamin (MULTIVITAMIN ADULT PO) Take by mouth daily.     Fluocinolone Acetonide Body 0.01 % OIL Apply to scalp 2 nights a week cover with a cap, shampoo in the morning 120 mL 5   fluticasone (  FLONASE) 50 MCG/ACT nasal spray Place 1-2 sprays into both nostrils daily. 16 g 0   ibuprofen (ADVIL) 800 MG tablet Take 1 tablet (800 mg total) by mouth 3 (three) times daily. 21 tablet 0   Cholecalciferol (VITAMIN D3 PO) Take by mouth daily.     No facility-administered medications prior to visit.    Allergies  Allergen Reactions   Latex Rash    Also causes itching    ROS Review of Systems  Neurological:  Positive for headaches (over the last week, intermittent, right upper frontal aspect).  Respiratory:  Negative for shortness of breath.   Cardiovascular:  Negative for chest pain and palpitations.  Gastrointestinal:  Negative for constipation and diarrhea.  Genitourinary:  Negative for dysuria, frequency and urgency.   Musculoskeletal:  Negative for myalgias.  Psychiatric/Behavioral:  Negative for depression and suicidal ideas.   All other systems reviewed and are negative.    Objective:    Physical Exam  Gen: NAD, resting comfortably HEENT: TMs normal bilaterally. OP clear. No thyromegaly noted.  CV: RRR with no murmurs appreciated Pulm: NWOB, CTAB with no crackles, wheezes, or rhonchi GI: Normal bowel sounds present. Soft, Nontender, Nondistended. MSK: no edema, cyanosis, or clubbing noted Skin: warm, dry Neuro: CN2-12 grossly intact. Strength 5/5 in upper and lower extremities.  Psych: Normal affect and thought content  BP 108/80   Pulse 74   Temp 97.6 F (36.4 C) (Temporal)   Ht 5' 3.5" (1.613 m)   Wt 126 lb (57.2 kg)   SpO2 100%   BMI 21.97 kg/m  Wt Readings from Last 3 Encounters:  05/13/21 126 lb (57.2 kg)  01/08/21 114 lb 5 oz (51.9 kg)  08/13/20 121 lb (54.9 kg)     There are no preventive care reminders to display for this patient.   There are no preventive care reminders to display for this patient.  Lab Results  Component Value Date   TSH 1.34 08/07/2020   Lab Results  Component Value Date   WBC 6.4 08/07/2020   HGB 11.7 08/07/2020   HCT 34.5 (L) 08/07/2020   MCV 98.0 08/07/2020   PLT 229 08/07/2020   Lab Results  Component Value Date   NA 137 08/07/2020   K 3.9 08/07/2020   CO2 24 08/07/2020   GLUCOSE 77 08/07/2020   BUN 14 08/07/2020   CREATININE 0.70 08/07/2020   BILITOT 0.4 08/07/2020   ALKPHOS 48 12/24/2018   AST 17 08/07/2020   ALT 12 08/07/2020   PROT 7.1 08/07/2020   ALBUMIN 4.3 12/24/2018   CALCIUM 9.2 08/07/2020   GFR 88.81 12/24/2018   Lab Results  Component Value Date   CHOL 182 08/07/2020   Lab Results  Component Value Date   HDL 63 08/07/2020   Lab Results  Component Value Date   LDLCALC 105 (H) 08/07/2020   Lab Results  Component Value Date   TRIG 54 08/07/2020   Lab Results  Component Value Date   CHOLHDL 2.9  08/07/2020   No results found for: HGBA1C    Assessment & Plan:   Problem List Items Addressed This Visit       Musculoskeletal and Integument   Alopecia (capitis) totalis    Cont f/u as scheduled with derm and medication prescribed prn        Genitourinary   Recurrent vaginitis    F/u prn symptoms occur        Other   Depot contraception    F/u q51m as  scheduled      Acute nonintractable headache - Primary    Recommended excedrin migraine, increase water intake, lab work today pending results. Instructed to wear glasses daily to see if headache improvement. If all neg will consider ref to neuro      Relevant Orders   TSH   C-reactive protein   Sedimentation rate    No orders of the defined types were placed in this encounter.   Follow-up: No follow-ups on file.    Eugenia Pancoast, FNP

## 2021-06-16 ENCOUNTER — Other Ambulatory Visit: Payer: Self-pay | Admitting: Family

## 2021-06-16 DIAGNOSIS — Z1231 Encounter for screening mammogram for malignant neoplasm of breast: Secondary | ICD-10-CM

## 2021-06-22 ENCOUNTER — Ambulatory Visit: Payer: 59 | Admitting: Dermatology

## 2021-06-22 ENCOUNTER — Other Ambulatory Visit: Payer: Self-pay

## 2021-06-22 ENCOUNTER — Telehealth: Payer: Self-pay

## 2021-06-22 VITALS — BP 102/66

## 2021-06-22 DIAGNOSIS — L669 Cicatricial alopecia, unspecified: Secondary | ICD-10-CM

## 2021-06-22 DIAGNOSIS — L668 Other cicatricial alopecia: Secondary | ICD-10-CM | POA: Diagnosis not present

## 2021-06-22 DIAGNOSIS — L700 Acne vulgaris: Secondary | ICD-10-CM | POA: Diagnosis not present

## 2021-06-22 DIAGNOSIS — L219 Seborrheic dermatitis, unspecified: Secondary | ICD-10-CM

## 2021-06-22 MED ORDER — DOXYCYCLINE MONOHYDRATE 100 MG PO CAPS
100.0000 mg | ORAL_CAPSULE | Freq: Every day | ORAL | 4 refills | Status: DC
Start: 2021-06-22 — End: 2021-08-24

## 2021-06-22 MED ORDER — KETOCONAZOLE 2 % EX SHAM
1.0000 | MEDICATED_SHAMPOO | CUTANEOUS | 5 refills | Status: DC
Start: 2021-06-22 — End: 2022-10-25

## 2021-06-22 MED ORDER — FLUOCINONIDE 0.05 % EX SOLN: 1.0000 "application " | Freq: Every day | CUTANEOUS | 2 refills | Status: DC

## 2021-06-22 MED ORDER — ADAPALENE 0.3 % EX GEL: 1.0000 "application " | Freq: Every day | CUTANEOUS | 3 refills | Status: DC

## 2021-06-22 MED ORDER — FLUOCINOLONE ACETONIDE BODY 0.01 % EX OIL: 1.0000 "application " | TOPICAL_OIL | CUTANEOUS | 5 refills | Status: DC

## 2021-06-22 MED ORDER — MINOXIDIL 2.5 MG PO TABS: 2.5000 mg | ORAL_TABLET | Freq: Every day | ORAL | 2 refills | Status: DC

## 2021-06-22 MED ORDER — CLINDAMYCIN PHOS-BENZOYL PEROX 1.2-5 % EX GEL
1.0000 | Freq: Every morning | CUTANEOUS | 3 refills | Status: DC
Start: 2021-06-22 — End: 2021-08-24

## 2021-06-22 NOTE — Telephone Encounter (Signed)
I spoke with pt and she started with symptoms on 06/21/21. Pt had another appt today and did not work. Pt said symptoms continue with runny nose,sneezing, dry cough, body aches and H/A pain level 8. Temp is 99.2. pt has scratchy throat, head congestion and pt feels really tired. Pt does not have SOB., CP vomiting or diarrhea.pt has not done covid test yet. Pt make ck covid test in AM. Pt said she works for [employer] and is around a lot of children and thinks she may need a  note to be out of work 06/23/21 - 06/25/21. Pt scheduled VV with Romilda Garret NP for 06/23/21 at 8:40. UC & ED precautions given an pt voiced understanding.Sending note to Romilda Garret NP and Anastasiya CMA.

## 2021-06-22 NOTE — Telephone Encounter (Signed)
Adapalene denied by pt insurance. No alternative given. Please advise.

## 2021-06-22 NOTE — Progress Notes (Signed)
Follow-Up Visit   Subjective  Connie [name] is a 46 y.o. female who presents for the following: Central Centrifiugal scarring alopecia (Scalp, 70m f/u, Rogaine 5% prn, Fluocinonide solution qohs, d/c Doxycycline due to stomach upsetn) and Acne (Face, Differin 0.3% gel qohs, not helping).   The following portions of the chart were reviewed this encounter and updated as appropriate:       Review of Systems:  No other skin or systemic complaints except as noted in HPI or Assessment and Plan.  Objective  Well appearing patient in no apparent distress; mood and affect are within normal limits.  A focused examination was performed including scalp. Relevant physical exam findings are noted in the Assessment and Plan.  Scalp Some slight hair regrowth frontal scalp, temporal scalp, post crown scalp compared to baseline photos  face Hyperpigmented paps and closed comedones forehead  Scalp Diffuse greasy scaling scalp    Assessment & Plan  Central centrifugal scarring alopecia Scalp  Chronic scarring alopecia, Some improvement   Restart Doxycyline changing to Doxycycline monohydrate 100 mg 1 po qd with food and drink Cont Fluocinonide solution qhs aa itchy scalp Restart Derma-Smooth FS oil qd/bid to scalp/hairline D/c Rogaine Start Minoxidil 2.5mg  1/2 po qd BP today 102/66 Continue avoid perms, chemical/heat treatments to hair  Doxycycline should be taken with food to prevent nausea. Do not lay down for 30 minutes after taking. Be cautious with sun exposure and use good sun protection while on this medication. Pregnant women should not take this medication.    Topical steroids (such as triamcinolone, fluocinolone, fluocinonide, mometasone, clobetasol, halobetasol, betamethasone, hydrocortisone) can cause thinning and lightening of the skin if they are used for too long in the same area. Your physician has selected the right strength medicine for your problem and area affected on  the body. Please use your medication only as directed by your physician to prevent side effects.     doxycycline (MONODOX) 100 MG capsule - Scalp Take 1 capsule (100 mg total) by mouth daily. Take with food and drink  Fluocinolone Acetonide Body 0.01 % OIL - Scalp Apply 1 application topically as directed. Qd to bid to scalp and hairline  minoxidil (LONITEN) 2.5 MG tablet - Scalp Take 1 tablet (2.5 mg total) by mouth daily.  Related Medications fluocinonide (LIDEX) 0.05 % external solution Apply 1 application topically at bedtime. To affected itchy areas of scalp Avoid applying to face, groin, and axilla. Use as directed.  Acne vulgaris face   Start Duac gel qam to face Cont Adapalene 0.3% gel qhs to qohs to face as tolerated  Topical retinoid medications like tretinoin/Retin-A, adapalene/Differin, tazarotene/Fabior, and Epiduo/Epiduo Forte can cause dryness and irritation when first started. Only apply a pea-sized amount to the entire affected area. Avoid applying it around the eyes, edges of mouth and creases at the nose. If you experience irritation, use a good moisturizer first and/or apply the medicine less often. If you are doing well with the medicine, you can increase how often you use it until you are applying every night. Be careful with sun protection while using this medication as it can make you sensitive to the sun. This medicine should not be used by pregnant women.    Clindamycin-Benzoyl Per, Refr, (DUAC) gel - face Apply 1 application topically in the morning. Qam to face for acne  Adapalene (DIFFERIN) 0.3 % gel - face Apply 1 application topically at bedtime. Qhs to face for acne  Seborrheic dermatitis Scalp  Seborrheic  Dermatitis  -  is a chronic persistent rash characterized by pinkness and scaling most commonly of the mid face but also can occur on the scalp (dandruff), ears; mid chest, mid back and groin.  It tends to be exacerbated by stress and cooler  weather.  People who have neurologic disease may experience new onset or exacerbation of existing seborrheic dermatitis.  The condition is not curable but treatable and can be controlled.  Restart Ketoconazole 2% shampoo 1x/wk, let sit 10 minutes before rinsing out Fluocinonide solution qd/bid aas scalp prn itch  Related Medications ketoconazole (NIZORAL) 2 % shampoo Apply 1 application topically as directed. 1-2 times a week to wash scalp, let sit 10 minutes and rinse out   Return in about 2 months (around 08/20/2021) for Central centrifugal scarring alopecia, acne.  I, Ardis Rowan, RMA, am acting as scribe for [name], MD .  Documentation: I have reviewed the above documentation for accuracy and completeness, and I agree with the above.  [name] MD

## 2021-06-22 NOTE — Patient Instructions (Addendum)
If You Need Anything After Your Visit ° °If you have any questions or concerns for your doctor, please call our main line at [phone] and press option 4 to reach your doctor's medical assistant. If no one answers, please leave a voicemail as directed and we will return your call as soon as possible. Messages left after 4 pm will be answered the following business day.  ° °You may also send us a message via MyChart. We typically respond to MyChart messages within 1-2 business days. ° °For prescription refills, please ask your pharmacy to contact our office. Our fax number is [phone]. ° °If you have an urgent issue when the clinic is closed that cannot wait until the next business day, you can page your doctor at the number below.   ° °Please note that while we do our best to be available for urgent issues outside of office hours, we are not available 24/7.  ° °If you have an urgent issue and are unable to reach us, you may choose to seek medical care at your doctor's office, retail clinic, urgent care center, or emergency room. ° °If you have a medical emergency, please immediately call 911 or go to the emergency department. ° °Pager Numbers ° °- Dr. Kowalski: [phone] ° °- Dr. Moye: [phone] ° °- Dr. Stewart: [phone] ° °In the event of inclement weather, please call our main line at [phone] for an update on the status of any delays or closures. ° °Dermatology Medication Tips: °Please keep the boxes that topical medications come in in order to help keep track of the instructions about where and how to use these. Pharmacies typically print the medication instructions only on the boxes and not directly on the medication tubes.  ° °If your medication is too expensive, please contact our office at [phone] option 4 or send us a message through MyChart.  ° °We are unable to tell what your co-pay for medications will be in advance as this is different depending on your insurance coverage.  However, we may be able to find a substitute medication at lower cost or fill out paperwork to get insurance to cover a needed medication.  ° °If a prior authorization is required to get your medication covered by your insurance company, please allow us 1-2 business days to complete this process. ° °Drug prices often vary depending on where the prescription is filled and some pharmacies may offer cheaper prices. ° °The website www.goodrx.com contains coupons for medications through different pharmacies. The prices here do not account for what the cost may be with help from insurance (it may be cheaper with your insurance), but the website can give you the price if you did not use any insurance.  °- You can print the associated coupon and take it with your prescription to the pharmacy.  °- You may also stop by our office during regular business hours and pick up a GoodRx coupon card.  °- If you need your prescription sent electronically to a different pharmacy, notify our office through [facility] MyChart or by phone at [phone] option 4. ° ° ° ° °Si Usted Necesita Algo Después de Su Visita ° °También puede enviarnos un mensaje a través de MyChart. Por lo general respondemos a los mensajes de MyChart en el transcurso de 1 a 2 días hábiles. ° °Para renovar recetas, por favor pida a su farmacia que se ponga en contacto con nuestra oficina. Nuestro número de fax es el [phone]. ° °Si tiene   un asunto urgente cuando la clnica est cerrada y que no puede esperar hasta el siguiente da hbil, puede llamar/localizar a su doctor(a) al nmero que aparece a continuacin.   Por favor, tenga en cuenta que aunque hacemos todo lo posible para estar disponibles para asuntos urgentes fuera del horario de [place], no estamos disponibles las 24 horas del da, los 7 [address]  [address] de la [place].   Si tiene un problema urgente y no puede comunicarse con nosotros, puede optar por buscar atencin mdica  en el consultorio de su  doctor(a), en una clnica privada, en un centro de atencin urgente o en una sala de emergencias.  Si tiene Engineer, drilling, por favor llame inmediatamente al 911 o vaya a la sala de emergencias.  Nmeros de bper  - Dr. Gwen Pounds: [phone]  - Dra. Moye: [phone]  - Dra. Roseanne Reno: [phone]  En caso de inclemencias del [place], por favor llame a Lacy Duverney principal al [phone] para una actualizacin sobre el [place] de cualquier retraso o cierre.  Consejos para la medicacin en dermatologa: Por favor, guarde las cajas en las que vienen los medicamentos de uso tpico para ayudarle a seguir las instrucciones sobre dnde y cmo usarlos. Las farmacias generalmente imprimen las instrucciones del medicamento slo en las cajas y no directamente en los tubos del [place].   Si su medicamento es muy caro, por favor, pngase en contacto con Rolm Gala llamando al [phone] y presione la opcin 4 o envenos un mensaje a travs de Clinical cytogeneticist.   No podemos decirle cul ser su copago por los medicamentos por adelantado ya que esto es diferente dependiendo de la cobertura de su seguro. Sin embargo, es posible que podamos encontrar un medicamento sustituto a Audiological scientist un formulario para que el seguro cubra el medicamento que se considera necesario.   Si se requiere una autorizacin previa para que su compaa de seguros [place] su medicamento, por favor permtanos de 1 a 2 das hbiles para completar [address]  Los precios de los medicamentos varan con frecuencia dependiendo del Environmental consultant de dnde se surte la receta y alguna farmacias pueden ofrecer precios ms baratos.  El sitio web www.goodrx.com tiene cupones para medicamentos de Health and safety inspector. Los precios aqu no tienen en cuenta lo que podra costar con la ayuda del seguro (puede ser ms barato con su seguro), pero el sitio web puede darle el precio si no utiliz Tourist information centre manager.  - Puede imprimir el cupn  correspondiente y llevarlo con su receta a la farmacia.  - Tambin puede pasar por nuestra oficina durante el horario de atencin regular y Education officer, museum una tarjeta de cupones de GoodRx.  - Si necesita que su receta se enve electrnicamente a una farmacia diferente, informe a nuestra oficina a travs de MyChart de [place] o por telfono llamando al [phone] y presione la opcin 4.   Minoxidil 2.5mg , take 1/2 pill daily

## 2021-06-22 NOTE — Telephone Encounter (Signed)
[facility] - Client TELEPHONE ADVICE RECORD AccessNurse Patient Name: Connie [name] Gender: Female DOB: 31-Jul-1975 Age: 46 Y 4 M 6 D Return Phone Number: [phone] (Primary) Address: City/ State/ Zip: [place] [address] Client [facility] Day - Client Client Site  [facility] - Day Provider AA - PHYSICIAN, NOT LISTED- MD Contact Type Call Who Is Calling Patient / Member / Family / Caregiver Call Type Triage / Clinical Relationship To Patient Self Return Phone Number [phone] (Primary) Chief Complaint Cough Reason for Call Symptomatic / Request for Health Information Initial Comment Caller states she has a runny nose, sneezing, cough, low grade fever and body aches (started yesterday). Dr. Alfonse Alpers not listed, confirmed address. Call was transferred due to no appts available for today. Translation No Nurse Assessment Nurse: Violeta Gelinas, RN, Regulatory affairs officer (Eastern Time): 06/22/2021 3:04:01 PM Confirm and document reason for call. If symptomatic, describe symptoms. ---pt having runny nose, sneezing, body aches, cough, temp 99.2. symptoms started yesterday. Does the patient have any new or worsening symptoms? ---Yes Will a triage be completed? ---Yes Related visit to physician within the last 2 weeks? ---N/A Does the PT have any chronic conditions? (i.e. diabetes, asthma, this includes High risk factors for pregnancy, etc.) ---No Is the patient pregnant or possibly pregnant? (Ask all females between the ages of 82-55) ---No Is this a behavioral health or substance abuse call? ---No Guidelines Guideline Title Affirmed Question Affirmed Notes Nurse Date/Time (Eastern Time) Common Cold Common cold with no complications Violeta Gelinas, RN, [facility] 06/22/2021 3:05:57 PM Disp. Time Lamount Cohen Time) Disposition Final User 06/22/2021 3:10:09 PM Home Care Yes Whiteley, RN, Amber PLEASE NOTE: All  timestamps contained within this report are represented as [place] Standard Time. CONFIDENTIALTY NOTICE: This fax transmission is intended only for the addressee. It contains information that is legally privileged, confidential or otherwise protected from use or disclosure. If you are not the intended recipient, you are strictly prohibited from reviewing, disclosing, copying using or disseminating any of this information or taking any action in reliance on or regarding this information. If you have received this fax in error, please notify us immediately by telephone so that we can arrange for its return to [place]. Phone: [phone], Toll-Free: [phone], Fax: [phone] Page: 2 of 2 Call Id: [id number] Caller Disagree/Comply Comply Caller Understands Yes PreDisposition Did not know what to do Care Advice Given Per Guideline HOME CARE: * You should be able to treat this at home. FOR A RUNNY NOSE - BLOW YOUR NOSE: * Blowing your nose helps clean out your nose. Use a handkerchief or a paper tissue. NASAL WASHES FOR A STUFFY NOSE: * How it Helps: The salt water rinses out excess mucus and washes out any irritants (dust, allergens) that might be present. It also moistens the nasal cavity. * If your nose feels blocked, you should try using nasal washes first. NASAL DECONGESTANTS FOR A VERY STUFFY NOSE: * Pseudoephedrine (Sudafed): Available over-the-counter in pill form. Typical adult dosage is two 30 mg tablets every 6 hours. * Oxymetazoline Nasal Drops (Afrin in [place]; Drixoral in [place]): Available over-the-counter. Clean out the nose before using. Spray each nostril once, wait one minute for absorption, and then spray a second time. HUMIDIFIER: * The cold virus is present in your nasal secretions. * Cover your nose and mouth with a tissue when you sneeze or cough. * Wash your hands frequently with soap and water. * Nasal discharge 7 to 14 days *  Cough 2 to 3 weeks. EXPECTED COURSE: CALL BACK IF: *  Fever lasts over 3 days * Runny nose lasts over 10 days * You become short of breath * You become worse CARE ADVICE given per Common Cold (Adult) guideline. 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 advised to call back office tomorrow since there are no appointments today for some kind of appointment for the doctor note as most doctors won't do a dr note without seeing the patient. advised that other option could be a telemedicine visit for the dr note

## 2021-06-23 ENCOUNTER — Telehealth (INDEPENDENT_AMBULATORY_CARE_PROVIDER_SITE_OTHER): Payer: 59 | Admitting: Nurse Practitioner

## 2021-06-23 ENCOUNTER — Other Ambulatory Visit: Payer: 59

## 2021-06-23 VITALS — Temp 97.8°F

## 2021-06-23 DIAGNOSIS — R52 Pain, unspecified: Secondary | ICD-10-CM | POA: Diagnosis not present

## 2021-06-23 DIAGNOSIS — R509 Fever, unspecified: Secondary | ICD-10-CM

## 2021-06-23 DIAGNOSIS — R051 Acute cough: Secondary | ICD-10-CM | POA: Diagnosis not present

## 2021-06-23 DIAGNOSIS — G4489 Other headache syndrome: Secondary | ICD-10-CM | POA: Diagnosis not present

## 2021-06-23 MED ORDER — TRETINOIN 0.05 % EX CREA: TOPICAL_CREAM | CUTANEOUS | 3 refills | Status: DC

## 2021-06-23 NOTE — Progress Notes (Signed)
Patient ID: Connie [name], female    DOB: 28-Sep-1975, 46 y.o.   MRN: [id number]  Virtual visit completed through [place], a video enabled telemedicine application. Due to national recommendations of social distancing due to COVID-19, a virtual visit is felt to be most appropriate for this patient at this time. Reviewed limitations, risks, security and privacy concerns of performing a virtual visit and the availability of in person appointments. I also reviewed that there may be a patient responsible charge related to this service. The patient agreed to proceed.   Patient location: home Provider location: [place] [facility], office Persons participating in this virtual visit: patient, provider   If any vitals were documented, they were collected by patient at home unless specified below.    Temp 97.8 F (36.6 C) Comment: per patient this morning   CC: URI symptoms  Subjective:   HPI: Connie [name] is a 46 y.o. female presenting on 06/23/2021 for head congestion (Sx started on 06/20/21- symptoms continue with runny nose,sneezing, dry cough, body aches and H/A pain level 8. Temp is 99.2. pt has scratchy throat, head congestion and pt feels really tired. Has an appt to get covid testing done at [facility] today at 10:30. 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 Interim medical history since our last visit reviewed. Allergies and medications reviewed and updated. Outpatient Medications Prior to Visit  Medication Sig Dispense Refill   Adapalene (DIFFERIN) 0.3 % gel Apply 1 application topically at bedtime. Qhs to face for acne 45 g 3   Ascorbic Acid (VITAMIN C PO) Take by mouth daily.     Clindamycin-Benzoyl Per, Refr, (DUAC) gel  Apply 1 application topically in the morning. Qam to face for acne 45 g 3   doxycycline (MONODOX) 100 MG capsule Take 1 capsule (100 mg total) by mouth daily. Take with food and drink 30 capsule 4   Fluocinolone Acetonide Body 0.01 % OIL Apply 1 application topically as directed. Qd to bid to scalp and hairline 120 mL 5   fluocinonide (LIDEX) 0.05 % external solution Apply 1 application topically at bedtime. To affected itchy areas of scalp Avoid applying to face, groin, and axilla. Use as directed. 180 mL 2   ketoconazole (NIZORAL) 2 % shampoo Apply 1 application topically as directed. 1-2 times a week to wash scalp, let sit 10 minutes and rinse out 120 mL 5   minoxidil (LONITEN) 2.5 MG tablet Take 1 tablet (2.5 mg total) by mouth daily. 30 tablet 2   Multiple Vitamin (MULTIVITAMIN ADULT PO) Take by mouth daily.     No facility-administered medications prior to visit.     Per HPI unless specifically indicated in ROS section below Review of Systems  Constitutional:  Positive for chills, fatigue and fever. Negative for appetite change.  HENT:  Positive for congestion. Negative for ear discharge, ear pain, sinus pressure, sinus pain and sore throat.   Respiratory:  Positive for cough. Negative for shortness of breath.   Cardiovascular:  Negative for chest pain.  Gastrointestinal:  Negative for abdominal pain, diarrhea, nausea and vomiting.  Musculoskeletal:  Positive for myalgias.  Neurological:  Positive for headaches.  Objective:  Temp 97.8 F (36.6 C) Comment: per patient this morning  Wt  Readings from Last 3 Encounters:  05/13/21 126 lb (57.2 kg)  01/08/21 114 lb 5 oz (51.9 kg)  08/13/20 121 lb (54.9 kg)       Physical exam: Gen: alert, NAD, not ill appearing Pulm: speaks in complete sentences without increased work of breathing Psych: normal mood, normal thought content      Results for orders placed or performed in visit on 05/13/21  TSH  Result Value Ref Range   TSH 1.07  0.35 - 5.50 uIU/mL  C-reactive protein  Result Value Ref Range   CRP <1.0 0.5 - 20.0 mg/dL  Sedimentation rate  Result Value Ref Range   Sed Rate 1 0 - 20 mm/hr   Assessment & Plan:   Problem List Items Addressed This Visit       Other   Acute cough    Pending testing      Relevant Orders   COVID-19, Flu A+B and RSV   Other headache syndrome    Given signs and symptoms patient will need to be tested for COVID, flu, and RSV.  Pending test results may continue over-the-counter analgesics as needed      Relevant Orders   COVID-19, Flu A+B and RSV   Fever    Given signs and symptoms patient will need to be tested for COVID, flu, and RSV.  Pending test results may continue over-the-counter analgesics as needed      Relevant Orders   COVID-19, Flu A+B and RSV   Body aches - Primary    Given signs and symptoms patient will need to be tested for COVID, flu, and RSV.  Pending test results may continue over-the-counter analgesics as needed      Relevant Orders   COVID-19, Flu A+B and RSV     No orders of the defined types were placed in this encounter.  Orders Placed This Encounter  Procedures   COVID-19, Flu A+B and RSV    Standing Status:   Future    Standing Expiration Date:   06/30/2021    Order Specific Question:   Previously tested for COVID-19    Answer:   Yes    Order Specific Question:   Resident in a congregate (group) care setting    Answer:   No    Order Specific Question:   Is the patient student?    Answer:   No    Order Specific Question:   Employed in healthcare setting    Answer:   No    Order Specific Question:   Pregnant    Answer:   Unknown    Order Specific Question:   Has patient completed COVID vaccination(s) (2 doses of Pfizer/Moderna 1 dose of [place])    Answer:   Yes    Order Specific Question:   Has patient completed COVID Booster / 3rd dose    Answer:   Yes    I discussed the assessment and treatment plan with the patient. The  patient was provided an opportunity to ask questions and all were answered. The patient agreed with the plan and demonstrated an understanding of the instructions. The patient was advised to call back or seek an in-person evaluation if the symptoms worsen or if the condition fails to improve as anticipated.  Follow up plan: No follow-ups on file.  Romilda Garret, NP

## 2021-06-23 NOTE — Telephone Encounter (Signed)
Will evaluated at office visit.  

## 2021-06-23 NOTE — Assessment & Plan Note (Signed)
Pending testing

## 2021-06-23 NOTE — Addendum Note (Signed)
Addended by: Epifania Gore on: 06/23/2021 09:27 AM   Modules accepted: Orders

## 2021-06-23 NOTE — Assessment & Plan Note (Signed)
Given signs and symptoms patient will need to be tested for COVID, flu, and RSV.  Pending test results may continue over-the-counter analgesics as needed °

## 2021-06-23 NOTE — Assessment & Plan Note (Signed)
Given signs and symptoms patient will need to be tested for COVID, flu, and RSV.  Pending test results may continue over-the-counter analgesics as needed

## 2021-06-24 ENCOUNTER — Telehealth: Payer: Self-pay | Admitting: Family

## 2021-06-24 LAB — COVID-19, FLU A+B AND RSV
Influenza A, NAA: NOT DETECTED
Influenza B, NAA: NOT DETECTED
RSV, NAA: NOT DETECTED
SARS-CoV-2, NAA: NOT DETECTED

## 2021-06-24 NOTE — Telephone Encounter (Signed)
Pt called stating that she needs a doctor note for the visit she had on yesterday. Please advise.

## 2021-06-25 NOTE — Telephone Encounter (Signed)
Ok for the note? 

## 2021-06-25 NOTE — Telephone Encounter (Signed)
Ok for the note.   Thank you

## 2021-06-25 NOTE — Telephone Encounter (Signed)
Patient calling today to request a note for work since she has received her test results and has been out of work for three days (Wed-Fr) saw Audria Nine on 1.20.23  Patient would like the note sent through my chart so that she can print out.

## 2021-06-25 NOTE — Telephone Encounter (Signed)
Work note is done and released via Clinical cytogeneticist. Patient advised.

## 2021-06-30 ENCOUNTER — Ambulatory Visit (INDEPENDENT_AMBULATORY_CARE_PROVIDER_SITE_OTHER): Payer: 59

## 2021-06-30 ENCOUNTER — Other Ambulatory Visit: Payer: Self-pay

## 2021-06-30 ENCOUNTER — Ambulatory Visit: Payer: 59

## 2021-06-30 DIAGNOSIS — Z3042 Encounter for surveillance of injectable contraceptive: Secondary | ICD-10-CM | POA: Diagnosis not present

## 2021-06-30 MED ORDER — MEDROXYPROGESTERONE ACETATE 150 MG/ML IM SUSP
150.0000 mg | Freq: Once | INTRAMUSCULAR | Status: AC
  Administered 2021-06-30: 16:00:00 150 mg via INTRAMUSCULAR

## 2021-06-30 NOTE — Progress Notes (Signed)
Per orders of Eugenia Pancoast, NP, injection of Depo Provera given by Pilar Grammes, CMA in [place]. Next dates 09-15-21 to 09-29-21. Patient tolerated injection well.  Sending to Dr Glori Bickers in Tabitha's absence this pm.

## 2021-07-23 ENCOUNTER — Ambulatory Visit
Admission: RE | Admit: 2021-07-23 | Discharge: 2021-07-23 | Disposition: A | Discharge status: Home / Self Care | Payer: Managed Care, Other (non HMO) | Source: Ambulatory Visit | Attending: Family | Admitting: Family

## 2021-07-23 DIAGNOSIS — Z1231 Encounter for screening mammogram for malignant neoplasm of breast: Secondary | ICD-10-CM

## 2021-08-24 ENCOUNTER — Ambulatory Visit: Payer: Managed Care, Other (non HMO) | Admitting: Dermatology

## 2021-08-24 ENCOUNTER — Other Ambulatory Visit: Payer: Self-pay

## 2021-08-24 DIAGNOSIS — L668 Other cicatricial alopecia: Secondary | ICD-10-CM

## 2021-08-24 DIAGNOSIS — L669 Cicatricial alopecia, unspecified: Secondary | ICD-10-CM

## 2021-08-24 DIAGNOSIS — L700 Acne vulgaris: Secondary | ICD-10-CM | POA: Diagnosis not present

## 2021-08-24 DIAGNOSIS — L810 Postinflammatory hyperpigmentation: Secondary | ICD-10-CM

## 2021-08-24 MED ORDER — MINOXIDIL 2.5 MG PO TABS: 2.5000 mg | ORAL_TABLET | Freq: Every day | ORAL | 2 refills | Status: DC

## 2021-08-24 MED ORDER — CLINDAMYCIN PHOS-BENZOYL PEROX 1.2-5 % EX GEL: 1.0000 "application " | Freq: Every morning | CUTANEOUS | 3 refills | Status: DC

## 2021-08-24 MED ORDER — DOXYCYCLINE MONOHYDRATE 100 MG PO CAPS: 100.0000 mg | ORAL_CAPSULE | Freq: Every day | ORAL | 4 refills | Status: DC

## 2021-08-24 MED ORDER — KETOCONAZOLE 2 % EX SHAM: 1.0000 "application " | MEDICATED_SHAMPOO | Freq: Once | CUTANEOUS | 2 refills | Status: AC

## 2021-08-24 NOTE — Patient Instructions (Addendum)
?Continue doxycycline 100 mg once daily with food ?Continue minoxidil 2.5 mg 1/2 tablet daily ?Continue fluocinonide oil to aa prn itch ?Continue Derma-Smoothe F/S oil 1-2 times daily to scalp ?Start ketoconazole 2% shampoo apply two times per week, massage into scalp and leave in for 10 minutes before rinsing out ? ?Doxycycline should be taken with food to prevent nausea. Do not lay down for 30 minutes after taking. Be cautious with sun exposure and use good sun protection while on this medication. Pregnant women should not take this medication.  ? ?If You Need Anything After Your Visit ? ?If you have any questions or concerns for your doctor, please call our main line at [phone] and press option 4 to reach your doctor's medical assistant. If no one answers, please leave a voicemail as directed and we will return your call as soon as possible. Messages left after 4 pm will be answered the following business day.  ? ?You may also send [place] a message via MyChart. We typically respond to MyChart messages within 1-2 business days. ? ?For prescription refills, please ask your pharmacy to contact our office. Our fax number is [phone]. ? ?If you have an urgent issue when the clinic is closed that cannot wait until the next business day, you can page your doctor at the number below.   ? ?Please note that while we do our best to be available for urgent issues outside of office hours, we are not available 24/7.  ? ?If you have an urgent issue and are unable to reach [place], you may choose to seek medical care at your doctor's office, retail clinic, urgent care center, or emergency room. ? ?If you have a medical emergency, please immediately call 911 or go to the emergency department. ? ?Pager Numbers ? ?- Dr. Gwen Pounds: [phone] ? ?- Dr. Neale Burly: [phone] ? ?- Dr. Roseanne Reno: [phone] ? ?In the event of inclement weather, please call our main line at [phone] for an update on the status of any delays or  closures. ? ?Dermatology Medication Tips: ?Please keep the boxes that topical medications come in in order to help keep track of the instructions about where and how to use these. Pharmacies typically print the medication instructions only on the boxes and not directly on the medication tubes.  ? ?If your medication is too expensive, please contact our office at [phone] option 4 or send [place] a message through MyChart.  ? ?We are unable to tell what your co-pay for medications will be in advance as this is different depending on your insurance coverage. However, we may be able to find a substitute medication at lower cost or fill out paperwork to get insurance to cover a needed medication.  ? ?If a prior authorization is required to get your medication covered by your insurance company, please allow [place] 1-2 business days to complete this process. ? ?Drug prices often vary depending on where the prescription is filled and some pharmacies may offer cheaper prices. ? ?The website www.goodrx.com contains coupons for medications through different pharmacies. The prices here do not account for what the cost may be with help from insurance (it may be cheaper with your insurance), but the website can give you the price if you did not use any insurance.  ?- You can print the associated coupon and take it with your prescription to the pharmacy.  ?- You may also stop by our office during regular business hours and pick up a GoodRx coupon card.  ?-  If you need your prescription sent electronically to a different pharmacy, notify our office through [facility] or by phone at [phone] option 4. ? ? ? ? ?Si Usted Necesita Algo Despu?s de Su Visita ? ?Tambi?n puede enviarnos un mensaje a trav?s de MyChart. Por lo general respondemos a los mensajes de MyChart en el transcurso de 1 a 2 d?as h?biles. ? ?Para renovar recetas, por favor pida a su farmacia que se ponga en contacto con nuestra oficina. Nuestro n?mero de fax  es el [phone]. ? ?Si tiene un asunto urgente cuando la cl?nica est? cerrada y que no puede esperar hasta el siguiente d?a h?bil, puede llamar/localizar a su doctor(a) al n?mero que aparece a continuaci?n.  ? ?Por favor, tenga en cuenta que aunque hacemos todo lo posible para estar disponibles para asuntos urgentes fuera del horario de oficina, no estamos disponibles las 24 horas del d?a, los 7 d?as de la semana.  ? ?Si tiene un problema urgente y no puede comunicarse con nosotros, puede optar por buscar atenci?n m?dica  en el consultorio de su doctor(a), en una cl?nica privada, en un centro de atenci?n urgente o en una sala de emergencias. ? ?Si tiene Engineer, maintenance (IT) m?dica, por favor llame inmediatamente al 911 o vaya a la sala de emergencias. ? ?N?meros de b?per ? ?- Dr. Nehemiah Massed: [phone] ? ?- Dra. Moye: [phone] ? ?- Dra. Nicole Kindred: [phone] ? ?En caso de inclemencias del tiempo, por favor llame a nuestra l?nea principal al [phone] para una actualizaci?n sobre el estado de cualquier retraso o cierre. ? ?Consejos para la medicaci?n en dermatolog?a: ?Por favor, guarde las cajas en las que vienen los medicamentos de uso t?pico para ayudarle a seguir las instrucciones sobre d?nde y c?mo usarlos. Las farmacias generalmente imprimen las instrucciones del medicamento s?lo en las cajas y no directamente en los tubos del [place].  ? ?Si su medicamento es muy caro, por favor, p?ngase en contacto con Zigmund Daniel llamando al [phone] y presione la opci?n 4 o env?enos un mensaje a trav?s de MyChart.  ? ?No podemos decirle cu?l ser? su copago por los medicamentos por adelantado ya que esto es diferente dependiendo de la cobertura de su seguro. Sin embargo, es posible que podamos encontrar un medicamento sustituto a Electrical engineer un formulario para que el seguro cubra el medicamento que se considera necesario.  ? ?Si se requiere Ardelia Mems autorizaci?n previa para que su compa??a de seguros [place]  su medicamento, por favor perm?tanos de 1 a 2 d?as h?biles para completar este proceso. ? ?Los precios de los medicamentos var?an con frecuencia dependiendo del Environmental consultant de d?nde se surte la receta y alguna farmacias pueden ofrecer precios m?s baratos. ? ?El sitio web www.goodrx.com tiene cupones para medicamentos de Airline pilot. Los precios aqu? no tienen en cuenta lo que podr?a costar con la ayuda del seguro (puede ser m?s barato con su seguro), pero el sitio web puede darle el precio si no utiliz? ning?n seguro.  ?- Puede imprimir el cup?n correspondiente y llevarlo con su receta a la farmacia.  ?- Tambi?n puede pasar por nuestra oficina durante el horario de atenci?n regular y recoger una tarjeta de cupones de GoodRx.  ?- Si necesita que su receta se env?e electr?nicamente a Chiropodist, informe a nuestra oficina a trav?s de MyChart de [place] o por tel?fono llamando al [phone] y presione la opci?n 4. ? ?

## 2021-08-24 NOTE — Progress Notes (Signed)
? ?Follow-Up Visit ?  ?Subjective  ?Connie [name] is a 46 y.o. female who presents for the following: Follow-up (Patient here today for acne and alopecia follow up. Patient currently using adapalene 0.3% gel and Duac for acne and advises her acne is improved. ) and Alopecia (Patient is using DermaSmoothe oil twice daily to scalp and fluocinonide as needed for itch. She is also taking doxycycline 100 mg daily and minoxidil 2.5 mg 1/2 tablet daily. Patient advises she is doing well on current treatment. ). ? ? ?The following portions of the chart were reviewed this encounter and updated as appropriate:  ?  ?  ? ?Review of Systems:  No other skin or systemic complaints except as noted in HPI or Assessment and Plan. ? ?Objective  ?Well appearing patient in no apparent distress; mood and affect are within normal limits. ? ?A focused examination was performed including face, scalp. Relevant physical exam findings are noted in the Assessment and Plan. ? ?Scalp ?BP 124/80 ? ?Areas of hair loss with loss of follicular ostia crown of scalp. ? ?Some regrowth noted at BL temporal and frontal hairlines when compared to baseline photos. ? ? ? ? ?face ?Face is clear, small hyperpigmented macules on forehead ? ? ? 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 Condition is symptomatic/ bothersome to patient. Not currently at goal.  Some improvement at temporal/fronatl scalp ? ?CCCA is a chronic/progressive and irreversible patterned form of scarring alopecia that affects middle-aged women of African descent that presents with progressive hair loss, starting as a single patch at the vertex of the scalp and then expanding in a centrifugal and symmetrical pattern. Triggering or aggravation of the disease may occur following traumatic hair care practices, such as cornrows and braiding, extensions, weaves with sewn-in or glued-on  hair, use of hot combs, and frequent use of hair relaxers. These practices should be discontinued to slow progression of disease. Therapies may stop or slow progression but generally do not lead to hair regrowth in scarred areas.  ? ?Continue doxycycline 100 mg once daily with food ?Continue minoxidil 2.5 mg 1/2 tablet daily ?Continue fluocinonide oil to aa prn itch ?Continue Derma-Smoothe F/S oil 1-2 times daily to scalp ?Start ketoconazole 2% shampoo apply two times per week, massage into scalp and leave in for 10 minutes before rinsing out ? ?Doxycycline should be taken with food to prevent nausea. Do not lay down for 30 minutes after taking. Be cautious with sun exposure and use good sun protection while on this medication. Pregnant women should not take this medication.  ? ? ? ?ketoconazole (NIZORAL) 2 % shampoo - Scalp ?Apply 1 application. topically once for 1 dose. apply 1-2 times per week, massage into scalp and leave in for 10 minutes before rinsing out ? ?Related Medications ?Fluocinolone Acetonide Body 0.01 % OIL ?Apply 1 application topically as directed. Qd to bid to scalp and hairline ? ?fluocinonide (LIDEX) 0.05 % external solution ?Apply 1 application topically at bedtime. To affected itchy areas of scalp Avoid applying to face, groin, and axilla. Use as directed. ? ?doxycycline (MONODOX) 100 MG capsule ?Take 1 capsule (100 mg total) by mouth daily. Take with food and drink ? ?minoxidil (LONITEN) 2.5 MG tablet ?Take 1 tablet (2.5 mg total) by mouth daily. ? ?Acne vulgaris ?face ? ?With PIH ?Chronic condition with duration or expected duration over one year. Currently well-controlled.  ? ?Continue Duac QAM ?  Continue adapalene 0.3% gel QHS ? ?Related Medications ?tretinoin (RETIN-A) 0.05 % cream ?Apply daily at night to forehead as tolerated. ? ?Clindamycin-Benzoyl Per, Refr, (DUAC) gel ?Apply 1 application. topically in the morning. Qam to face for acne ? ? ?Return in about 4 months (around  12/24/2021) for Alopecia, acne. ? ?Graciella Belton, RMA, am acting as scribe for Brendolyn Patty, MD . ? ?Documentation: I have reviewed the above documentation for accuracy and completeness, and I agree with the above. ? ?Brendolyn Patty MD  ? ?

## 2021-09-02 ENCOUNTER — Ambulatory Visit (INDEPENDENT_AMBULATORY_CARE_PROVIDER_SITE_OTHER): Payer: Managed Care, Other (non HMO) | Admitting: Family

## 2021-09-02 ENCOUNTER — Encounter: Payer: Self-pay | Admitting: Family

## 2021-09-02 VITALS — BP 102/50 | HR 94 | Temp 98.9°F | Resp 16 | Ht 63.5 in | Wt 133.6 lb

## 2021-09-02 DIAGNOSIS — D509 Iron deficiency anemia, unspecified: Secondary | ICD-10-CM | POA: Diagnosis not present

## 2021-09-02 DIAGNOSIS — E559 Vitamin D deficiency, unspecified: Secondary | ICD-10-CM | POA: Insufficient documentation

## 2021-09-02 DIAGNOSIS — R5383 Other fatigue: Secondary | ICD-10-CM | POA: Insufficient documentation

## 2021-09-02 DIAGNOSIS — G44209 Tension-type headache, unspecified, not intractable: Secondary | ICD-10-CM | POA: Insufficient documentation

## 2021-09-02 DIAGNOSIS — M62838 Other muscle spasm: Secondary | ICD-10-CM | POA: Diagnosis not present

## 2021-09-02 MED ORDER — CYCLOBENZAPRINE HCL 10 MG PO TABS: 10.0000 mg | ORAL_TABLET | Freq: Three times a day (TID) | ORAL | 0 refills | Status: DC | PRN

## 2021-09-02 NOTE — Patient Instructions (Addendum)
Recommend starting vitamin D3 2000 mcg once daily. [employer] made is a good brand. ? ?Work on 5-6 bottles of water a day.   ? ?Stop by the lab prior to leaving today. I will notify you of your results once received.  ? ?It was a pleasure seeing you today! Please do not hesitate to reach out with any questions and or concerns. ? ?Regards,  ? ?Roxy Mastandrea ?FNP-C ? ? ? ?

## 2021-09-02 NOTE — Progress Notes (Signed)
**Note Connie-Identified via Obfuscation** ? ?Established Patient Office Visit ? ?Subjective:  ?Patient ID: Connie [name], female    DOB: [birth year]  Age: 46 y.o. MRN: [id number] ? ?CC:  ?Chief Complaint  ?Patient presents with  ? Headache  ?  Off and on the right side of the head. No medication is helping the pain.  ? ? ?HPI ?Connie [name] is here today for follow up.  ?Pt is with acute concerns.  ? ?Still with headaches, ongoing. No real improvement since last visit, still constant dullness, sometimes sharp still on right upper frontal area. Denies sinus pressure no nasal congestion, no sinus pain. No pnd. No ear pain. Did recently go to eye exam, prescription didn't change at all, she states only slightly near sited. She is currently taking ibuprofen tylenol without any help. Drinks water daily tries to aim for 2-3 bottles a day. No real blurry vision. No neck pain no neck tenderness. Some soreness posterior left side of neck but went away.  ? Does often feel fatigue ? No change of pregnancy per patient.  ? ?Headaches have been over the last one year, has not had headaches prior to this.  ? ?Does have h/o IDA, doesn't take iron at current.  ? ?Menses: very spotty, comes and goes. On depo. Started depo only recently.  ? ? ? ?Past Medical History:  ?Diagnosis Date  ? Acne   ? Dermatitis   ? ? ?Past Surgical History:  ?Procedure Laterality Date  ? CESAREAN SECTION    ? ? ?Family History  ?Problem Relation Age of Onset  ? Breast cancer Maternal Grandmother   ? Thyroid cancer Mother   ? Hypertension Father   ? ? ?Social History  ? ?Socioeconomic History  ? Marital status: Single  ?  Spouse name: Not on file  ? Number of children: Not on file  ? Years of education: Not on file  ? Highest education level: Not on file  ?Occupational History  ? Occupation: head start, works with kids  ?Tobacco Use  ? Smoking status: Never  ? Smokeless tobacco: Never  ?Vaping Use  ? Vaping Use: Never used  ?Substance and Sexual Activity  ? Alcohol use: Yes  ?  Alcohol/week: 7.0  standard drinks  ?  Types: 7 Glasses of wine per week  ?  Comment: occasional  ? Drug use: Never  ? Sexual activity: Not Currently  ?  Partners: Male  ?  Birth control/protection: Condom  ?Other Topics Concern  ? Not on file  ?Social History Narrative  ? Not on file  ? ?Social Determinants of Health  ? ?Financial Resource Strain: Not on file  ?Food Insecurity: Not on file  ?Transportation Needs: Not on file  ?Physical Activity: Not on file  ?Stress: Not on file  ?Social Connections: Not on file  ?Intimate Partner Violence: Not on file  ? ? ?Outpatient Medications Prior to Visit  ?Medication Sig Dispense Refill  ? Ascorbic Acid (VITAMIN C PO) Take by mouth daily.    ? Clindamycin-Benzoyl Per, Refr, (DUAC) gel Apply 1 application. topically in the morning. Qam to face for acne 45 g 3  ? doxycycline (MONODOX) 100 MG capsule Take 1 capsule (100 mg total) by mouth daily. Take with food and drink 30 capsule 4  ? ketoconazole (NIZORAL) 2 % shampoo Apply 1 application topically as directed. 1-2 times a week to wash scalp, let sit 10 minutes and rinse out 120 mL 5  ? minoxidil (LONITEN) 2.5 MG tablet Take 1 tablet (2.5 mg  total) by mouth daily. 30 tablet 2  ? Multiple Vitamin (MULTIVITAMIN ADULT PO) Take by mouth daily.    ? tretinoin (RETIN-A) 0.05 % cream Apply daily at night to forehead as tolerated. 45 g 3  ? fluocinonide (LIDEX) 0.05 % external solution Apply 1 application topically at bedtime. To affected itchy areas of scalp Avoid applying to face, groin, and axilla. Use as directed. 180 mL 2  ? Fluocinolone Acetonide Body 0.01 % OIL Apply 1 application topically as directed. Qd to bid to scalp and hairline (Patient not taking: Reported on 09/02/2021) 120 mL 5  ? ?No facility-administered medications prior to visit.  ? ? ?Allergies  ?Allergen Reactions  ? Latex Rash  ?  Also causes itching  ? ? ?ROS ?Review of Systems  ?Constitutional:  Positive for fatigue. Negative for chills, fever and unexpected weight change.   ?HENT:  Negative for congestion, ear pain, sinus pressure, sinus pain, sore throat and trouble swallowing.   ?Eyes:  Negative for visual disturbance.  ?Respiratory:  Negative for shortness of breath.   ?Cardiovascular:  Negative for chest pain.  ?Gastrointestinal:  Negative for abdominal pain.  ?Genitourinary:  Negative for difficulty urinating.  ?Skin:  Negative for rash.  ?Neurological:  Positive for headaches. Negative for dizziness.  ? ?\ ? ?  ?Objective:  ?  ?Physical Exam ?Constitutional:   ?   General: She is not in acute distress. ?   Appearance: She is well-developed and normal weight. She is not ill-appearing, toxic-appearing or diaphoretic.  ?HENT:  ?   Head: Normocephalic.  ?   Mouth/Throat:  ?   Mouth: Mucous membranes are moist.  ?Eyes:  ?   Extraocular Movements: Extraocular movements intact.  ?   Pupils: Pupils are equal, round, and reactive to light.  ?Neck:  ? ?Cardiovascular:  ?   Rate and Rhythm: Normal rate and regular rhythm.  ?Pulmonary:  ?   Effort: Pulmonary effort is normal.  ?   Breath sounds: Normal breath sounds.  ?Musculoskeletal:     ?   General: Normal range of motion.  ?   Cervical back: Normal range of motion. Pain with movement (slight with flexion) and muscular tenderness present. Normal range of motion.  ?Skin: ?   General: Skin is warm.  ?Neurological:  ?   Mental Status: She is alert and oriented to person, place, and time. Mental status is at baseline.  ?   Cranial Nerves: No cranial nerve deficit or facial asymmetry.  ?   Sensory: No sensory deficit.  ?   Motor: No weakness.  ?   Gait: Gait normal.  ?Psychiatric:     ?   Mood and Affect: Mood normal.     ?   Speech: Speech normal.     ?   Behavior: Behavior normal.  ? ? ? ? ?BP (!) 102/50   Pulse 94   Temp 98.9 ?F (37.2 ?C)   Resp 16   Ht 5' 3.5" (1.613 m)   Wt 133 lb 9 oz (60.6 kg)   SpO2 98%   BMI 23.29 kg/m?  ?Wt Readings from Last 3 Encounters:  ?09/02/21 133 lb 9 oz (60.6 kg)  ?05/13/21 126 lb (57.2 kg)   ?01/08/21 114 lb 5 oz (51.9 kg)  ? ? ? ?Health Maintenance Due  ?Topic Date Due  ? COVID-19 Vaccine (4 - Booster for Pfizer series) 04/28/2020  ? ? ?There are no preventive care reminders to display for this patient. ? ?Lab Results  ?  Component Value Date  ? TSH 1.07 05/13/2021  ? ?Lab Results  ?Component Value Date  ? WBC 6.4 08/07/2020  ? HGB 11.7 08/07/2020  ? HCT 34.5 (L) 08/07/2020  ? MCV 98.0 08/07/2020  ? PLT 229 08/07/2020  ? ?Lab Results  ?Component Value Date  ? NA 137 08/07/2020  ? K 3.9 08/07/2020  ? CO2 24 08/07/2020  ? GLUCOSE 77 08/07/2020  ? BUN 14 08/07/2020  ? CREATININE 0.70 08/07/2020  ? BILITOT 0.4 08/07/2020  ? ALKPHOS 48 12/24/2018  ? AST 17 08/07/2020  ? ALT 12 08/07/2020  ? PROT 7.1 08/07/2020  ? ALBUMIN 4.3 12/24/2018  ? CALCIUM 9.2 08/07/2020  ? GFR 88.81 12/24/2018  ? ?Lab Results  ?Component Value Date  ? CHOL 182 08/07/2020  ? ?Lab Results  ?Component Value Date  ? HDL 63 08/07/2020  ? ?Lab Results  ?Component Value Date  ? LDLCALC 105 (H) 08/07/2020  ? ?Lab Results  ?Component Value Date  ? TRIG 54 08/07/2020  ? ?Lab Results  ?Component Value Date  ? CHOLHDL 2.9 08/07/2020  ? ?No results found for: HGBA1C ? ?  ?Assessment & Plan:  ? ?Problem List Items Addressed This Visit   ? ?  ? Musculoskeletal and Integument  ? Muscle spasms of neck  ?  Neck exercises ?rx flexeril 10 mg prn  ?Warm heat to site ?Posture correcting ?  ?  ? Relevant Medications  ? cyclobenzaprine (FLEXERIL) 10 MG tablet  ?  ? Other  ? Iron deficiency anemia - Primary  ?  Ordering cbc ?Pending results ?  ?  ? Relevant Orders  ? CBC with Differential  ? B12 and Folate Panel  ? Other fatigue  ?  Ordering b12 , cmp, folate ?Pending results ?  ?  ? Relevant Orders  ? CBC with Differential  ? B12 and Folate Panel  ? Basic metabolic panel  ? Vitamin D deficiency  ?  Ordering vitamin d pending results ?Advised very low vitamin d can also contribute to fatigue ?  ?  ? Relevant Orders  ? VITAMIN D 25 Hydroxy (Vit-D Deficiency,  Fractures)  ? Muscle tension headache  ?  rx flexeril 10 mg qhs prn  ?Muscular tension may contribute to headaches. ?Warm heat to site prn  ?Increase oral intake of water ?Try to identify triggers ?Work on Designer, fashion/clothing

## 2021-09-03 ENCOUNTER — Telehealth: Payer: Self-pay

## 2021-09-03 ENCOUNTER — Other Ambulatory Visit: Payer: Self-pay | Admitting: Family

## 2021-09-03 DIAGNOSIS — E559 Vitamin D deficiency, unspecified: Secondary | ICD-10-CM

## 2021-09-03 LAB — CBC WITH DIFFERENTIAL/PLATELET
Basophils Absolute: 0.1 10*3/uL (ref 0.0–0.1)
Basophils Relative: 1 % (ref 0.0–3.0)
Eosinophils Absolute: 0.1 10*3/uL (ref 0.0–0.7)
Eosinophils Relative: 1.7 % (ref 0.0–5.0)
HCT: 37.9 % (ref 36.0–46.0)
Hemoglobin: 12.7 g/dL (ref 12.0–15.0)
Lymphocytes Relative: 38 % (ref 12.0–46.0)
Lymphs Abs: 2.5 10*3/uL (ref 0.7–4.0)
MCHC: 33.4 g/dL (ref 30.0–36.0)
MCV: 95.5 fl (ref 78.0–100.0)
Monocytes Absolute: 0.7 10*3/uL (ref 0.1–1.0)
Monocytes Relative: 10.1 % (ref 3.0–12.0)
Neutro Abs: 3.3 10*3/uL (ref 1.4–7.7)
Neutrophils Relative %: 49.2 % (ref 43.0–77.0)
Platelets: 219 10*3/uL (ref 150.0–400.0)
RBC: 3.97 Mil/uL (ref 3.87–5.11)
RDW: 12 % (ref 11.5–15.5)
WBC: 6.6 10*3/uL (ref 4.0–10.5)

## 2021-09-03 LAB — B12 AND FOLATE PANEL
Folate: 16.5 ng/mL (ref >5.9)
Vitamin B-12: 233 pg/mL (ref 211–911)

## 2021-09-03 LAB — BASIC METABOLIC PANEL
BUN: 16 mg/dL (ref 6–23)
CO2: 26 mEq/L (ref 19–32)
Calcium: 9.3 mg/dL (ref 8.4–10.5)
Chloride: 106 mEq/L (ref 96–112)
Creatinine, Ser: 0.82 mg/dL (ref 0.40–1.20)
GFR: 86.92 mL/min (ref >60.00)
Glucose, Bld: 55 mg/dL — ABNORMAL LOW (ref 70–99)
Potassium: 3.9 mEq/L (ref 3.5–5.1)
Sodium: 138 mEq/L (ref 135–145)

## 2021-09-03 LAB — VITAMIN D 25 HYDROXY (VIT D DEFICIENCY, FRACTURES): VITD: 26.2 ng/mL — ABNORMAL LOW (ref 30.00–100.00)

## 2021-09-03 MED ORDER — VITAMIN D (ERGOCALCIFEROL) 1.25 MG (50000 UNIT) PO CAPS: 50000.0000 [IU] | ORAL_CAPSULE | ORAL | 0 refills | Status: AC

## 2021-09-03 NOTE — Assessment & Plan Note (Signed)
Ordering cbc ?Pending results ?

## 2021-09-03 NOTE — Assessment & Plan Note (Signed)
rx flexeril 10 mg qhs prn  ?Muscular tension may contribute to headaches. ?Warm heat to site prn  ?Increase oral intake of water ?Try to identify triggers ?Work on posture correcting ?Consider fioricet if no relief.  ?

## 2021-09-03 NOTE — Telephone Encounter (Signed)
Called pt and she stated she was at work and ask if I could call back after 2 and I agreed. ?

## 2021-09-03 NOTE — Assessment & Plan Note (Signed)
Ordering vitamin d pending results ?Advised very low vitamin d can also contribute to fatigue ?

## 2021-09-03 NOTE — Assessment & Plan Note (Addendum)
Neck exercises ?rx flexeril 10 mg prn  ?Warm heat to site ?Posture correcting ?

## 2021-09-03 NOTE — Progress Notes (Signed)
Your vitamin D was a bit on the lower range. I will send an RX for vitamin D3 50,000 IU which you will take once weekly for 8 weeks. Once RX is complete, please continue over the counter Vitamin D3 1000 IU once daily. Return to the clinic for a follow up in three months to repeat your Vitamin D level.  ? ?Glucose was incidentally found to be low, I am assuming you get periods of hypoglycemia where sugar levels drop. I recommend you make sure you are eating every 2-3 weeks.  ? ?B12 is on the lower end of normal I highly suggest daily vitamin b12 1000 mcg once daily. This will help with fatigue. ?

## 2021-09-03 NOTE — Progress Notes (Signed)
O

## 2021-09-03 NOTE — Assessment & Plan Note (Signed)
Ordering b12 , cmp, folate ?Pending results ?

## 2021-09-06 NOTE — Telephone Encounter (Signed)
Pt is returning our call re: labs ? ?Please call the patient at [phone] ?

## 2021-09-07 ENCOUNTER — Telehealth: Payer: Self-pay

## 2021-09-07 NOTE — Telephone Encounter (Signed)
Called patient reviewed all information and repeated back to me. Will call if any questions.  ? ?

## 2021-09-15 ENCOUNTER — Ambulatory Visit (INDEPENDENT_AMBULATORY_CARE_PROVIDER_SITE_OTHER): Payer: Managed Care, Other (non HMO)

## 2021-09-15 DIAGNOSIS — Z3042 Encounter for surveillance of injectable contraceptive: Secondary | ICD-10-CM

## 2021-09-15 MED ORDER — MEDROXYPROGESTERONE ACETATE 150 MG/ML IM SUSP
150.0000 mg | Freq: Once | INTRAMUSCULAR | Status: AC
  Administered 2021-09-15: 150 mg via INTRAMUSCULAR

## 2021-09-15 NOTE — Progress Notes (Signed)
Per orders of Mort Sawyers, FNP injection of Depo Provera given by Consuella Lose. ?Patient tolerated injection well. Next injection due between these dates 12/01/21-12/15/21. ?

## 2021-09-23 ENCOUNTER — Other Ambulatory Visit: Payer: Self-pay | Admitting: Family

## 2021-09-23 DIAGNOSIS — E559 Vitamin D deficiency, unspecified: Secondary | ICD-10-CM

## 2021-10-19 ENCOUNTER — Other Ambulatory Visit: Payer: Self-pay | Admitting: Family

## 2021-10-19 DIAGNOSIS — E559 Vitamin D deficiency, unspecified: Secondary | ICD-10-CM

## 2021-12-03 ENCOUNTER — Encounter: Payer: Self-pay | Admitting: Family

## 2021-12-03 ENCOUNTER — Ambulatory Visit (INDEPENDENT_AMBULATORY_CARE_PROVIDER_SITE_OTHER)
Admission: RE | Admit: 2021-12-03 | Discharge: 2021-12-03 | Disposition: A | Discharge status: Home / Self Care | Payer: Commercial Managed Care - HMO | Source: Ambulatory Visit | Attending: Family | Admitting: Family

## 2021-12-03 ENCOUNTER — Ambulatory Visit (INDEPENDENT_AMBULATORY_CARE_PROVIDER_SITE_OTHER): Payer: Commercial Managed Care - HMO | Admitting: Family

## 2021-12-03 VITALS — BP 98/62 | HR 71 | Temp 98.7°F | Resp 16 | Ht 63.5 in | Wt 130.6 lb

## 2021-12-03 DIAGNOSIS — Z3042 Encounter for surveillance of injectable contraceptive: Secondary | ICD-10-CM | POA: Insufficient documentation

## 2021-12-03 DIAGNOSIS — M542 Cervicalgia: Secondary | ICD-10-CM

## 2021-12-03 DIAGNOSIS — M549 Dorsalgia, unspecified: Secondary | ICD-10-CM | POA: Diagnosis not present

## 2021-12-03 DIAGNOSIS — M62838 Other muscle spasm: Secondary | ICD-10-CM | POA: Diagnosis not present

## 2021-12-03 DIAGNOSIS — G8929 Other chronic pain: Secondary | ICD-10-CM | POA: Diagnosis not present

## 2021-12-03 LAB — POCT URINE PREGNANCY: Preg Test, Ur: NEGATIVE

## 2021-12-03 MED ORDER — MEDROXYPROGESTERONE ACETATE 150 MG/ML IM SUSP
150.0000 mg | Freq: Once | INTRAMUSCULAR | Status: AC
  Administered 2021-12-03: 150 mg via INTRAMUSCULAR

## 2021-12-03 NOTE — Patient Instructions (Addendum)
Complete xray(s) prior to leaving today. I will notify you of your results once received.  Due to recent changes in healthcare laws, you may see results of your imaging and/or laboratory studies on MyChart before I have had a chance to review them.  I understand that in some cases there may be results that are confusing or concerning to you. Please understand that not all results are received at the same time and often I may need to interpret multiple results in order to provide you with the best plan of care or course of treatment. Therefore, I ask that you please give me 2 business days to thoroughly review all your results before contacting my office for clarification. Should we see a critical lab result, you will be contacted sooner.   It was a pleasure seeing you today! Please do not hesitate to reach out with any questions and or concerns.  Regards,   Eowyn Tabone FNP-C  

## 2021-12-03 NOTE — Assessment & Plan Note (Signed)
Thoracic xray pending results Consider physical therapy

## 2021-12-03 NOTE — Assessment & Plan Note (Signed)
Work on neck exercises Flexeril prn  Heat to site prn  Possible need for pt Cervical spine xray pending and ordered

## 2021-12-03 NOTE — Progress Notes (Signed)
Established Patient Office Visit  Subjective:  Patient ID: Connie [name], female    DOB: 06-28-1975  Age: 46 y.o. MRN: [id number]  CC:  Chief Complaint  Patient presents with  . Contraception    HPI Connie [name] is here today for follow up.   Pt is with acute concerns.  Neck pain and mid thoracic back pain, sometimes occur together sometimes separately. No known injury or trauma to site. Flare ups can be about 2-3 times a week, then will go away for a few week and then flares up again. When she gets the neck pain it radiates down the mid back. No low back pain. When flare ups occur she has pain at times moving her head.   Today not in pain and without symptoms.   During flare ups she will use heating pad and take ibprofen/adviil and or tylenol that provides her with mild relief.   This has been ongoing for the last few years.  Lifting heavy objects worsens the pain. Tries to sit up straight but will slouch at times.  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   Uses flexeril at times with some relief.   Past Medical History:  Diagnosis Date  . Acne   . Dermatitis     Past Surgical History:  Procedure Laterality Date  . CESAREAN SECTION      Family History  Problem Relation Age of Onset  . Breast cancer Maternal Grandmother   . Thyroid cancer Mother   . Hypertension Father     Social History   Socioeconomic History  . Marital status: Single    Spouse name: Not on file  . Number of children: Not on file  . Years of education: Not on file  . Highest education level: Not on file  Occupational History  . Occupation: head start, works with kids  Tobacco Use  . Smoking status: Never  . Smokeless tobacco: Never  Vaping Use  . Vaping Use: Never used  Substance and Sexual Activity  . Alcohol use: Yes    Alcohol/week: 7.0 standard drinks of alcohol    Types: 7 Glasses of  wine per week    Comment: occasional  . Drug use: Never  . Sexual activity: Not Currently    Partners: Male    Birth control/protection: Condom  Other Topics Concern  . Not on file  Social History Narrative  . Not on file   Social Determinants of Health   Financial Resource Strain: Not on file  Food Insecurity: Not on file  Transportation Needs: Not on file  Physical Activity: Not on file  Stress: Not on file  Social Connections: Not on file  Intimate Partner Violence: Not on file    Outpatient Medications Prior to Visit  Medication Sig Dispense Refill  . Ascorbic Acid (VITAMIN C PO) Take by mouth daily.    . Clindamycin-Benzoyl Per, Refr, (DUAC) gel Apply 1 application. topically in the morning. Qam to face for acne 45 g 3  . cyclobenzaprine (FLEXERIL) 10 MG tablet Take 1 tablet (10 mg total) by mouth 3 (three) times daily as needed for muscle spasms. 30 tablet 0  . doxycycline (MONODOX) 100 MG capsule Take 1 capsule (100 mg total) by mouth daily. Take with food and drink 30 capsule 4  . ketoconazole (NIZORAL) 2 % shampoo Apply 1 application topically as directed.  1-2 times a week to wash scalp, let sit 10 minutes and rinse out 120 mL 5  . minoxidil (LONITEN) 2.5 MG tablet Take 1 tablet (2.5 mg total) by mouth daily. 30 tablet 2  . Multiple Vitamin (MULTIVITAMIN ADULT PO) Take by mouth daily.    Marland Kitchen tretinoin (RETIN-A) 0.05 % cream Apply daily at night to forehead as tolerated. 45 g 3   No facility-administered medications prior to visit.    Allergies  Allergen Reactions  . Latex Rash    Also causes itching        Objective:    Physical Exam Vitals reviewed.  Constitutional:      General: She is not in acute distress.    Appearance: Normal appearance. She is normal weight. She is not ill-appearing, toxic-appearing or diaphoretic.  Cardiovascular:     Rate and Rhythm: Normal rate and regular rhythm.  Pulmonary:     Effort: Pulmonary effort is normal.     Breath  sounds: Normal breath sounds.  Musculoskeletal:        General: Normal range of motion.     Cervical back: Full passive range of motion without pain. Muscular tenderness (posterior base of left side of neck with tightness) present.  Skin:    General: Skin is warm.  Neurological:     Mental Status: She is alert.      BP 98/62   Pulse 71   Temp 98.7 F (37.1 C)   Resp 16   Ht 5' 3.5" (1.613 m)   Wt 130 lb 9 oz (59.2 kg)   SpO2 98%   BMI 22.77 kg/m  Wt Readings from Last 3 Encounters:  12/03/21 130 lb 9 oz (59.2 kg)  09/02/21 133 lb 9 oz (60.6 kg)  05/13/21 126 lb (57.2 kg)     Health Maintenance Due  Topic Date Due  . COVID-19 Vaccine (4 - Pfizer series) 04/28/2020    There are no preventive care reminders to display for this patient.  Lab Results  Component Value Date   TSH 1.07 05/13/2021   Lab Results  Component Value Date   WBC 6.6 09/02/2021   HGB 12.7 09/02/2021   HCT 37.9 09/02/2021   MCV 95.5 09/02/2021   PLT 219.0 09/02/2021   Lab Results  Component Value Date   NA 138 09/02/2021   K 3.9 09/02/2021   CO2 26 09/02/2021   GLUCOSE 55 (L) 09/02/2021   BUN 16 09/02/2021   CREATININE 0.82 09/02/2021   BILITOT 0.4 08/07/2020   ALKPHOS 48 12/24/2018   AST 17 08/07/2020   ALT 12 08/07/2020   PROT 7.1 08/07/2020   ALBUMIN 4.3 12/24/2018   CALCIUM 9.3 09/02/2021   GFR 86.92 09/02/2021   Lab Results  Component Value Date   CHOL 182 08/07/2020   Lab Results  Component Value Date   HDL 63 08/07/2020   Lab Results  Component Value Date   LDLCALC 105 (H) 08/07/2020   Lab Results  Component Value Date   TRIG 54 08/07/2020   Lab Results  Component Value Date   CHOLHDL 2.9 08/07/2020   No results found for: "HGBA1C"    Assessment & Plan:   Problem List Items Addressed This Visit       Musculoskeletal and Integument   Muscle spasms of neck    Flexeril prn         Other   Neck pain, chronic    Work on neck exercises Flexeril prn   Heat to  site prn  Possible need for pt Cervical spine xray pending and ordered      Relevant Orders   DG Cervical Spine Complete   Mid back pain, chronic    Thoracic xray pending results Consider physical therapy       Relevant Orders   DG Thoracic Spine W/Swimmers   Encounter for surveillance of injectable contraceptive - Primary   Relevant Orders   POCT urine pregnancy (Completed)   Encounter for Depo-Provera contraception    hcg negative Depo provera administered in office Pt tolerated procedure well  Verbal consent obtained prior to administration         Meds ordered this encounter  Medications  . medroxyPROGESTERone (DEPO-PROVERA) injection 150 mg    Follow-up: No follow-ups on file.    Mort Sawyers, FNP

## 2021-12-03 NOTE — Assessment & Plan Note (Signed)
Flexeril prn  

## 2021-12-03 NOTE — Assessment & Plan Note (Signed)
hcg negative Depo provera administered in office Pt tolerated procedure well  Verbal consent obtained prior to administration

## 2021-12-06 ENCOUNTER — Telehealth: Payer: Self-pay

## 2021-12-06 NOTE — Telephone Encounter (Signed)
Patient is calling in stating that she seen the results about the xray and wants to know if she can get some medication for it, also wanted to know if tabitha would fill out the paperwork she was left with on Friday.

## 2021-12-08 ENCOUNTER — Telehealth: Payer: Self-pay | Admitting: Family

## 2021-12-08 NOTE — Telephone Encounter (Signed)
Ok, so the medication when it acts up would be the flexeril that she said she already has at home. Also she can take ibuprofen/naproxen prn. Heat to site. Massage as able.   As far as the paperwork, can she send me another copy? I apologize but I think I left in patient room when we were talking about this.

## 2021-12-08 NOTE — Telephone Encounter (Signed)
Pt called and wanted to know if you were going to send any medication for the back pain? I did give her the results of the x-ray.

## 2021-12-08 NOTE — Telephone Encounter (Signed)
Patient dropped off FMLA paperwork to be filled out. Paperwork was placed in Tabitha's box. Thank you.

## 2021-12-09 NOTE — Telephone Encounter (Signed)
Forms placed in provider box

## 2021-12-17 ENCOUNTER — Telehealth: Payer: Self-pay | Admitting: Family

## 2021-12-17 NOTE — Telephone Encounter (Signed)
Patient calling in to follow-up on FMLA paperwork and if it has been completed.

## 2021-12-20 ENCOUNTER — Encounter: Payer: Self-pay | Admitting: Family

## 2021-12-20 ENCOUNTER — Other Ambulatory Visit: Payer: Self-pay | Admitting: Family

## 2021-12-20 DIAGNOSIS — G44209 Tension-type headache, unspecified, not intractable: Secondary | ICD-10-CM

## 2021-12-20 DIAGNOSIS — M549 Dorsalgia, unspecified: Secondary | ICD-10-CM

## 2021-12-20 DIAGNOSIS — M62838 Other muscle spasm: Secondary | ICD-10-CM

## 2021-12-20 DIAGNOSIS — Z0279 Encounter for issue of other medical certificate: Secondary | ICD-10-CM

## 2021-12-20 DIAGNOSIS — G8929 Other chronic pain: Secondary | ICD-10-CM

## 2021-12-20 DIAGNOSIS — M439 Deforming dorsopathy, unspecified: Secondary | ICD-10-CM

## 2021-12-20 MED ORDER — CYCLOBENZAPRINE HCL 10 MG PO TABS: 10.0000 mg | ORAL_TABLET | Freq: Three times a day (TID) | ORAL | 0 refills | Status: DC | PRN

## 2021-12-20 MED ORDER — METHYLPREDNISOLONE 4 MG PO TBPK: ORAL_TABLET | ORAL | 0 refills | Status: DC

## 2021-12-20 NOTE — Telephone Encounter (Signed)
Called and informed pt that copies was at the front. She also said she spoke to you about something for the pain in her back, and she has not received anything yet.

## 2021-12-20 NOTE — Progress Notes (Signed)
Mb ref levo

## 2021-12-23 ENCOUNTER — Telehealth: Payer: Self-pay

## 2021-12-23 ENCOUNTER — Telehealth: Payer: Self-pay | Admitting: Family

## 2021-12-23 NOTE — Telephone Encounter (Signed)
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 Please fax to Dene Gentry at [phone] if possible.

## 2021-12-23 NOTE — Telephone Encounter (Signed)
Spoke to pt and let her know I faxed the paperwork and a  copy is up front for her to pick up.

## 2021-12-23 NOTE — Telephone Encounter (Signed)
Connie [name],   Did we yet send out the [facility] and the other paperwork in support of this? This should be more than enough for the 'written letter' being requested.

## 2021-12-23 NOTE — Telephone Encounter (Signed)
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 Thank you!

## 2021-12-24 NOTE — Telephone Encounter (Signed)
Pt returning call would like a call back #[phone]

## 2021-12-24 NOTE — Telephone Encounter (Signed)
Received her paperwork from her job put in provider box.

## 2021-12-28 ENCOUNTER — Ambulatory Visit: Payer: Managed Care, Other (non HMO) | Admitting: Dermatology

## 2021-12-28 NOTE — Telephone Encounter (Signed)
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

## 2021-12-28 NOTE — Telephone Encounter (Signed)
Noted Please fax completed paperwork we discussed Connie [name]. Ok to close note.

## 2021-12-29 NOTE — Telephone Encounter (Signed)
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 Also stated that her old job was going to send her to another center where she did not have to ride the bus until Bendena sent in paperwork saying that the cushion was ok.

## 2021-12-29 NOTE — Telephone Encounter (Addendum)
Patient called back about paperwork and said she has some questions and if Tresa Endo could call her back as soon as possible. Callback is [phone]. She said the cushion wont help her back and that her job would need new info by August the 4th

## 2021-12-29 NOTE — Telephone Encounter (Signed)
Paperwork faxed °

## 2022-01-12 ENCOUNTER — Ambulatory Visit (INDEPENDENT_AMBULATORY_CARE_PROVIDER_SITE_OTHER): Payer: Commercial Managed Care - HMO | Admitting: Family

## 2022-01-12 ENCOUNTER — Encounter: Payer: Self-pay | Admitting: Family

## 2022-01-12 ENCOUNTER — Ambulatory Visit: Payer: Commercial Managed Care - HMO

## 2022-01-12 VITALS — BP 100/64 | HR 88 | Temp 97.9°F | Resp 16 | Ht 63.5 in | Wt 134.4 lb

## 2022-01-12 DIAGNOSIS — D519 Vitamin B12 deficiency anemia, unspecified: Secondary | ICD-10-CM

## 2022-01-12 DIAGNOSIS — D509 Iron deficiency anemia, unspecified: Secondary | ICD-10-CM

## 2022-01-12 DIAGNOSIS — E559 Vitamin D deficiency, unspecified: Secondary | ICD-10-CM | POA: Diagnosis not present

## 2022-01-12 DIAGNOSIS — E7800 Pure hypercholesterolemia, unspecified: Secondary | ICD-10-CM

## 2022-01-12 DIAGNOSIS — M542 Cervicalgia: Secondary | ICD-10-CM

## 2022-01-12 DIAGNOSIS — Z111 Encounter for screening for respiratory tuberculosis: Secondary | ICD-10-CM

## 2022-01-12 DIAGNOSIS — Z0000 Encounter for general adult medical examination without abnormal findings: Secondary | ICD-10-CM | POA: Diagnosis not present

## 2022-01-12 DIAGNOSIS — G8929 Other chronic pain: Secondary | ICD-10-CM

## 2022-01-12 DIAGNOSIS — M62838 Other muscle spasm: Secondary | ICD-10-CM

## 2022-01-12 DIAGNOSIS — Z3042 Encounter for surveillance of injectable contraceptive: Secondary | ICD-10-CM

## 2022-01-12 DIAGNOSIS — L630 Alopecia (capitis) totalis: Secondary | ICD-10-CM

## 2022-01-12 LAB — LIPID PANEL
Cholesterol: 172 mg/dL (ref 0–200)
HDL: 50.4 mg/dL (ref >39.00)
LDL Cholesterol: 112 mg/dL — ABNORMAL HIGH (ref 0–99)
NonHDL: 121.26
Total CHOL/HDL Ratio: 3
Triglycerides: 44 mg/dL (ref 0.0–149.0)
VLDL: 8.8 mg/dL (ref 0.0–40.0)

## 2022-01-12 LAB — VITAMIN D 25 HYDROXY (VIT D DEFICIENCY, FRACTURES): VITD: 27.51 ng/mL — ABNORMAL LOW (ref 30.00–100.00)

## 2022-01-12 LAB — VITAMIN B12: Vitamin B-12: 246 pg/mL (ref 211–911)

## 2022-01-12 NOTE — Assessment & Plan Note (Signed)
Patient Counseling(The following topics were reviewed): ? Preventative care handout given to pt  ?Health maintenance and immunizations reviewed. Please refer to Health maintenance section. ?Pt advised on safe sex, wearing seatbelts in car, and proper nutrition ?labwork ordered today for annual ?Dental health: Discussed importance of regular tooth brushing, flossing, and dental visits. ? ? ?

## 2022-01-12 NOTE — Assessment & Plan Note (Signed)
Flexeril 10 mg as needed Continue work on neck exercises

## 2022-01-12 NOTE — Assessment & Plan Note (Signed)
Continue vitamin D otc  Will check lab again today pending results

## 2022-01-12 NOTE — Assessment & Plan Note (Signed)
questionnaire completed Ordered quantiferon pending results

## 2022-01-12 NOTE — Progress Notes (Signed)
Established Patient Office Visit  Subjective:  Patient ID: Connie [name], female    DOB: 04/27/1976  Age: 46 y.o. MRN: [id number]  CC:  Chief Complaint  Patient presents with   Annual Exam    HPI [name] is here today for an annual comprehensive exam.   Pt is without acute concerns.   Mammogram: 07/23/21, negative Pap smear: 08/29/2019, neagtive due again next year 08/29/2022 Td will be due in 2030   Completed TB risk questionnaire as follows:  Were you born outside [place] in [place], [place], [place], [place] or [place]? No.  Have you traveled outside of [place] and lived for more than one month? No.   Do you have a compromised immune system such as from the following: HIV/AIDs, organ or bone  marrow transplant, diabetes, immunosuppressive medications, leukemia, lymphoma, cancer of head or neck, gastrectomy, or jejunal bypass, end stage renal disease on dialysis or silicosis? No.  Have you ever done one of the following? Used crack cocaine, injected illegal drugs, worked or resided in jail or prison, worked or resided at a homeless shelter, or worked as a Research scientist (physical sciences) in Careers information officer with patients? No.  Have you ever been exposed to anyone with infectious tb? No.  Completed TB symptom questionnaire as follows:   Do you currently have any of the following symptoms?  Unexplained cough more than 3 weeks? No. Unexplained fever lasting more than 3 weeks? No. Night sweats, that are leaving bedclothes and sheets wet? No. Shortness of breath? No. Chest pain? No. Unintentional weight loss? No. Unexplained fatigued? No.  Past Medical History:  Diagnosis Date   Acne    Dermatitis     Past Surgical History:  Procedure Laterality Date   CESAREAN SECTION      Family History  Problem Relation Age of Onset   Breast cancer Maternal Grandmother    Thyroid cancer Mother    Hypertension Father     Social History   Socioeconomic History   Marital  status: Single    Spouse name: Not on file   Number of children: Not on file   Years of education: Not on file   Highest education level: Not on file  Occupational History   Occupation: head start, works with kids  Tobacco Use   Smoking status: Never   Smokeless tobacco: Never  Vaping Use   Vaping Use: Never used  Substance and Sexual Activity   Alcohol use: Yes    Alcohol/week: 7.0 standard drinks of alcohol    Types: 7 Glasses of wine per week    Comment: occasional   Drug use: Never   Sexual activity: Not Currently    Partners: Male    Birth control/protection: Condom  Other Topics Concern   Not on file  Social History Narrative   Not on file   Social Determinants of Health   Financial Resource Strain: Not on file  Food Insecurity: Not on file  Transportation Needs: Not on file  Physical Activity: Not on file  Stress: Not on file  Social Connections: Not on file  Intimate Partner Violence: Not on file    Outpatient Medications Prior to Visit  Medication Sig Dispense Refill   Clindamycin-Benzoyl Per, Refr, (DUAC) gel Apply 1 application. topically in the morning. Qam to face for acne 45 g 3   cyclobenzaprine (FLEXERIL) 10 MG tablet Take 1 tablet (10 mg total) by mouth 3 (three) times daily as needed for muscle spasms. 30 tablet 0  doxycycline (MONODOX) 100 MG capsule Take 1 capsule (100 mg total) by mouth daily. Take with food and drink 30 capsule 4   ketoconazole (NIZORAL) 2 % shampoo Apply 1 application topically as directed. 1-2 times a week to wash scalp, let sit 10 minutes and rinse out 120 mL 5   medroxyPROGESTERone (DEPO-PROVERA) 150 MG/ML injection Inject 150 mg into the muscle every 3 (three) months.     minoxidil (LONITEN) 2.5 MG tablet Take 1 tablet (2.5 mg total) by mouth daily. 30 tablet 2   Multiple Vitamin (MULTIVITAMIN ADULT PO) Take by mouth daily.     tretinoin (RETIN-A) 0.05 % cream Apply daily at night to forehead as tolerated. 45 g 3   Ascorbic  Acid (VITAMIN C PO) Take by mouth daily.     methylPREDNISolone (MEDROL DOSEPAK) 4 MG TBPK tablet Take per package instructions 21 tablet 0   No facility-administered medications prior to visit.    Allergies  Allergen Reactions   Latex Rash    Also causes itching    ROS Review of Systems  Review of Systems  Respiratory:  Negative for shortness of breath.   Cardiovascular:  Negative for chest pain and palpitations.  Gastrointestinal:  Negative for constipation and diarrhea.  Genitourinary:  Negative for dysuria, frequency and urgency.  Musculoskeletal:  Negative for myalgias.  Psychiatric/Behavioral:  Negative for depression and suicidal ideas.   All other systems reviewed and are negative.    Objective:    Physical Exam  Gen: NAD, resting comfortably HEENT: TMs normal bilaterally. OP clear. No thyromegaly noted.  CV: RRR with no murmurs appreciated Pulm: NWOB, CTAB with no crackles, wheezes, or rhonchi GI: Normal bowel sounds present. Soft, Nontender, Nondistended. MSK: no edema, cyanosis, or clubbing noted Skin: warm, dry Neuro: CN2-12 grossly intact. Strength 5/5 in upper and lower extremities.  Psych: Normal affect and thought content  BP 100/64   Pulse 88   Temp 97.9 F (36.6 C)   Resp 16   Ht 5' 3.5" (1.613 m)   Wt 134 lb 6 oz (61 kg)   SpO2 100%   BMI 23.43 kg/m  Wt Readings from Last 3 Encounters:  01/12/22 134 lb 6 oz (61 kg)  12/03/21 130 lb 9 oz (59.2 kg)  09/02/21 133 lb 9 oz (60.6 kg)     Health Maintenance Due  Topic Date Due   INFLUENZA VACCINE  01/04/2022    There are no preventive care reminders to display for this patient.  Lab Results  Component Value Date   TSH 1.07 05/13/2021   Lab Results  Component Value Date   WBC 6.6 09/02/2021   HGB 12.7 09/02/2021   HCT 37.9 09/02/2021   MCV 95.5 09/02/2021   PLT 219.0 09/02/2021   Lab Results  Component Value Date   NA 138 09/02/2021   K 3.9 09/02/2021   CO2 26 09/02/2021    GLUCOSE 55 (L) 09/02/2021   BUN 16 09/02/2021   CREATININE 0.82 09/02/2021   BILITOT 0.4 08/07/2020   ALKPHOS 48 12/24/2018   AST 17 08/07/2020   ALT 12 08/07/2020   PROT 7.1 08/07/2020   ALBUMIN 4.3 12/24/2018   CALCIUM 9.3 09/02/2021   GFR 86.92 09/02/2021   Lab Results  Component Value Date   CHOL 182 08/07/2020   Lab Results  Component Value Date   HDL 63 08/07/2020   Lab Results  Component Value Date   LDLCALC 105 (H) 08/07/2020   Lab Results  Component Value Date  TRIG 54 08/07/2020   Lab Results  Component Value Date   CHOLHDL 2.9 08/07/2020   No results found for: "HGBA1C"    Assessment & Plan:   Problem List Items Addressed This Visit       Musculoskeletal and Integument   Alopecia (capitis) totalis    continue medications as prescribed by dermatology       Muscle spasms of neck    Flexeril 10 mg as needed Continue work on neck exercises        Other   Depot contraception    Continue injection every three months      Iron deficiency anemia    Cbc stable, reviewed from 08/2021      Vitamin D deficiency    Continue vitamin D otc  Will check lab again today pending results      Relevant Orders   VITAMIN D 25 Hydroxy (Vit-D Deficiency, Fractures)   Neck pain, chronic    Work on neck exercises  Heat to site as needed when flares occur Flexeril prn       Elevated LDL cholesterol level - Primary    Ordered lipid panel, pending results. Work on low cholesterol diet and exercise as tolerated       Relevant Orders   Lipid panel   Encounter for general adult medical examination without abnormal findings    Patient Counseling(The following topics were reviewed):  Preventative care handout given to pt  Health maintenance and immunizations reviewed. Please refer to Health maintenance section. Pt advised on safe sex, wearing seatbelts in car, and proper nutrition labwork ordered today for annual Dental health: Discussed importance of  regular tooth brushing, flossing, and dental visits.        Screening for tuberculosis    questionnaire completed Ordered quantiferon pending results      Relevant Orders   QuantiFERON-TB Gold Plus   Anemia due to vitamin B12 deficiency    Continue otc b12 daily  Check b12 level today pending results      Relevant Orders   Vitamin B12    No orders of the defined types were placed in this encounter.   Follow-up: Return in about 1 year (around 01/13/2023) for or as needed.    Eugenia Pancoast, FNP

## 2022-01-12 NOTE — Assessment & Plan Note (Signed)
Continue otc b12 daily  Check b12 level today pending results

## 2022-01-12 NOTE — Assessment & Plan Note (Signed)
Continue injection every three months

## 2022-01-12 NOTE — Assessment & Plan Note (Signed)
Ordered lipid panel, pending results. Work on low cholesterol diet and exercise as tolerated ? ?

## 2022-01-12 NOTE — Assessment & Plan Note (Signed)
Work on neck exercises  Heat to site as needed when flares occur Flexeril prn

## 2022-01-12 NOTE — Assessment & Plan Note (Signed)
Cbc stable, reviewed from 08/2021

## 2022-01-12 NOTE — Assessment & Plan Note (Signed)
continue medications as prescribed by dermatology

## 2022-01-12 NOTE — Patient Instructions (Signed)
  Stop by the lab prior to leaving today. I will notify you of your results once received.   Recommendations on keeping yourself healthy:  - Exercise at least 30-45 minutes a day, 3-4 days a week.  - Eat a low-fat diet with lots of fruits and vegetables, up to 7-9 servings per day.  - Seatbelts can save your life. Wear them always.  - Smoke detectors on every level of your home, check batteries every year.  - Eye Doctor - have an eye exam every 1-2 years  - Safe sex - if you may be exposed to STDs, use a condom.  - Alcohol -  If you drink, do it moderately, less than 2 drinks per day.  - Health Care Power of Attorney. Choose someone to speak for you if you are not able.  - Depression is common in our stressful world.If you're feeling down or losing interest in things you normally enjoy, please come in for a visit.  - Violence - If anyone is threatening or hurting you, please call immediately.  Due to recent changes in healthcare laws, you may see results of your imaging and/or laboratory studies on MyChart before I have had a chance to review them.  I understand that in some cases there may be results that are confusing or concerning to you. Please understand that not all results are received at the same time and often I may need to interpret multiple results in order to provide you with the best plan of care or course of treatment. Therefore, I ask that you please give me 2 business days to thoroughly review all your results before contacting my office for clarification. Should we see a critical lab result, you will be contacted sooner.   I will see you again in one year for your annual comprehensive exam unless otherwise stated and or with acute concerns.  It was a pleasure seeing you today! Please do not hesitate to reach out with any questions and or concerns.  Regards,   Fatimah Sundquist    

## 2022-01-13 ENCOUNTER — Other Ambulatory Visit: Payer: Self-pay | Admitting: Family

## 2022-01-13 DIAGNOSIS — E559 Vitamin D deficiency, unspecified: Secondary | ICD-10-CM

## 2022-01-13 MED ORDER — VITAMIN D (ERGOCALCIFEROL) 1.25 MG (50000 UNIT) PO CAPS: 50000.0000 [IU] | ORAL_CAPSULE | ORAL | 0 refills | Status: AC

## 2022-01-15 LAB — QUANTIFERON-TB GOLD PLUS
Mitogen-NIL: >10 IU/mL
NIL: 0.05 IU/mL
QuantiFERON-TB Gold Plus: NEGATIVE
TB1-NIL: 0.01 IU/mL
TB2-NIL: 0.03 IU/mL

## 2022-01-17 ENCOUNTER — Telehealth: Payer: Self-pay

## 2022-01-17 NOTE — Telephone Encounter (Signed)
Called and informed pt forms are ready for pick up

## 2022-01-17 NOTE — Telephone Encounter (Signed)
Patient called in stating she dropped off forms for her job last Wednesday and hasnt heard any updates but will need it completed today so she can start her job tomorrow. Wanting to know if we can call her once they are completed.

## 2022-02-03 ENCOUNTER — Other Ambulatory Visit: Payer: Self-pay | Admitting: Family

## 2022-02-03 DIAGNOSIS — E559 Vitamin D deficiency, unspecified: Secondary | ICD-10-CM

## 2022-03-08 ENCOUNTER — Ambulatory Visit: Payer: Commercial Managed Care - HMO

## 2022-05-10 ENCOUNTER — Other Ambulatory Visit: Payer: Self-pay | Admitting: Internal Medicine

## 2022-05-10 DIAGNOSIS — Z1231 Encounter for screening mammogram for malignant neoplasm of breast: Secondary | ICD-10-CM

## 2022-06-07 ENCOUNTER — Ambulatory Visit: Payer: Managed Care, Other (non HMO) | Admitting: Dermatology

## 2022-07-11 DIAGNOSIS — Z309 Encounter for contraceptive management, unspecified: Secondary | ICD-10-CM | POA: Diagnosis not present

## 2022-07-11 DIAGNOSIS — Z803 Family history of malignant neoplasm of breast: Secondary | ICD-10-CM | POA: Diagnosis not present

## 2022-07-11 DIAGNOSIS — Z124 Encounter for screening for malignant neoplasm of cervix: Secondary | ICD-10-CM | POA: Diagnosis not present

## 2022-07-11 DIAGNOSIS — R519 Headache, unspecified: Secondary | ICD-10-CM | POA: Diagnosis not present

## 2022-07-11 DIAGNOSIS — G8929 Other chronic pain: Secondary | ICD-10-CM | POA: Diagnosis not present

## 2022-07-11 DIAGNOSIS — Z0000 Encounter for general adult medical examination without abnormal findings: Secondary | ICD-10-CM | POA: Diagnosis not present

## 2022-07-11 DIAGNOSIS — M545 Low back pain, unspecified: Secondary | ICD-10-CM | POA: Diagnosis not present

## 2022-07-11 DIAGNOSIS — Z13220 Encounter for screening for lipoid disorders: Secondary | ICD-10-CM | POA: Diagnosis not present

## 2022-07-11 DIAGNOSIS — Z3042 Encounter for surveillance of injectable contraceptive: Secondary | ICD-10-CM | POA: Diagnosis not present

## 2022-07-12 DIAGNOSIS — Z13220 Encounter for screening for lipoid disorders: Secondary | ICD-10-CM | POA: Diagnosis not present

## 2022-07-12 DIAGNOSIS — Z0000 Encounter for general adult medical examination without abnormal findings: Secondary | ICD-10-CM | POA: Diagnosis not present

## 2022-07-19 ENCOUNTER — Ambulatory Visit (HOSPITAL_COMMUNITY)
Admission: EM | Admit: 2022-07-19 | Discharge: 2022-07-19 | Disposition: A | Discharge status: Home / Self Care | Payer: 59 | Attending: Emergency Medicine | Admitting: Emergency Medicine

## 2022-07-19 ENCOUNTER — Other Ambulatory Visit: Payer: Self-pay

## 2022-07-19 ENCOUNTER — Encounter (HOSPITAL_COMMUNITY): Payer: Self-pay | Admitting: *Deleted

## 2022-07-19 DIAGNOSIS — U071 COVID-19: Secondary | ICD-10-CM | POA: Diagnosis not present

## 2022-07-19 DIAGNOSIS — B349 Viral infection, unspecified: Secondary | ICD-10-CM

## 2022-07-19 LAB — POC INFLUENZA A AND B ANTIGEN (URGENT CARE ONLY)
INFLUENZA A ANTIGEN, POC: NEGATIVE
INFLUENZA B ANTIGEN, POC: NEGATIVE

## 2022-07-19 MED ORDER — ACETAMINOPHEN 325 MG PO TABS
650.0000 mg | ORAL_TABLET | Freq: Once | ORAL | Status: AC
  Administered 2022-07-19: 650 mg via ORAL

## 2022-07-19 MED ORDER — ACETAMINOPHEN 325 MG PO TABS
ORAL_TABLET | ORAL | Status: AC
  Filled 2022-07-19: qty 2

## 2022-07-19 NOTE — Discharge Instructions (Addendum)
I recommend either Delsym or Robitussin cough syrup Prop up at night to reduce cough Continue lots of fluids! Tylenol as needed for aches  We will call you if your covid test returns positive, and sent antivirals if indicated.

## 2022-07-19 NOTE — ED Triage Notes (Signed)
Pt reports having body aches ,HA and sore throat since yesterday.

## 2022-07-19 NOTE — ED Provider Notes (Signed)
[place] [facility]    CSN: [id number] Arrival date & time: 07/19/22  1924      History   Chief Complaint Chief Complaint  Patient presents with   Generalized Body Aches   Headache   Sore Throat    HPI Connie [name] is a 47 y.o. female.  Presents with bodyaches, headache, sore throat that started yesterday Today 8/10 pain Cough that's worse at night when laying flat No fevers. Eating and drinking normally  Has tried tylenol, dayquil/nyquil Works at a daycare; sick contacts with flu and covid  Past Medical History:  Diagnosis Date   Acne    Dermatitis     Patient Active Problem List   Diagnosis Date Noted   Elevated LDL cholesterol level 01/12/2022   Encounter for general adult medical examination without abnormal findings 01/12/2022   Screening for tuberculosis 01/12/2022   Anemia due to vitamin B12 deficiency 01/12/2022   Curvature of thoracic spine 12/20/2021   Neck pain, chronic 12/03/2021   Mid back pain, chronic 12/03/2021   Iron deficiency anemia 09/02/2021   Vitamin D deficiency 09/02/2021   Muscle spasms of neck 09/02/2021   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      Home Medications    Prior to Admission medications   Medication Sig Start Date End Date Taking? Authorizing Provider  Clindamycin-Benzoyl Per, Refr, (DUAC) gel Apply 1 application. topically in the morning. Qam to face for acne 08/24/21   Brendolyn Patty, MD  cyclobenzaprine (FLEXERIL) 10 MG tablet Take 1 tablet (10 mg total) by mouth 3 (three) times daily as needed for muscle spasms. 12/20/21   Eugenia Pancoast, FNP  ketoconazole (NIZORAL) 2 % shampoo Apply 1 application topically as directed. 1-2 times a week to wash scalp, let sit 10 minutes and rinse out 06/22/21   Brendolyn Patty, MD  medroxyPROGESTERone (DEPO-PROVERA) 150 MG/ML  injection Inject 150 mg into the muscle every 3 (three) months.    [provider]  minoxidil (LONITEN) 2.5 MG tablet Take 1 tablet (2.5 mg total) by mouth daily. 08/24/21   Brendolyn Patty, MD  Multiple Vitamin (MULTIVITAMIN ADULT PO) Take by mouth daily.    [provider]  tretinoin (RETIN-A) 0.05 % cream Apply daily at night to forehead as tolerated. 06/23/21   Brendolyn Patty, MD    Family History Family History  Problem Relation Age of Onset   Breast cancer Maternal Grandmother    Thyroid cancer Mother    Hypertension Father     Social History Social History   Tobacco Use   Smoking status: Never   Smokeless tobacco: Never  Vaping Use   Vaping Use: Never used  Substance Use Topics   Alcohol use: Yes    Alcohol/week: 7.0 standard drinks of alcohol    Types: 7 Glasses of wine per week    Comment: occasional   Drug use: Never     Allergies   Latex   Review of Systems Review of Systems As per HPI  Physical Exam Triage Vital Signs ED Triage Vitals  Enc Vitals Group     BP 07/19/22 1956 115/74     Pulse Rate 07/19/22 1956 72     Resp 07/19/22 1956 20     Temp 07/19/22 1956 99.2 F (37.3 C)     Temp src --  SpO2 07/19/22 1956 100 %     Weight --      Height --      Head Circumference --      Peak Flow --      Pain Score 07/19/22 1954 8     Pain Loc --      Pain Edu? --      Excl. in [place]? --    No data found.  Updated Vital Signs BP 115/74   Pulse 72   Temp 99.2 F (37.3 C)   Resp 20   SpO2 100%   Physical Exam Vitals and nursing note reviewed.  Constitutional:      General: She is not in acute distress. HENT:     Nose: No congestion or rhinorrhea.     Mouth/Throat:     Mouth: Mucous membranes are moist.     Pharynx: Oropharynx is clear. No posterior oropharyngeal erythema.     Tonsils: No tonsillar exudate or tonsillar abscesses. 0 on the right. 0 on the left.  Eyes:     Conjunctiva/sclera: Conjunctivae normal.   Cardiovascular:     Rate and Rhythm: Normal rate and regular rhythm.     Pulses: Normal pulses.     Heart sounds: Normal heart sounds.  Pulmonary:     Effort: Pulmonary effort is normal.     Breath sounds: Normal breath sounds.  Lymphadenopathy:     Cervical: No cervical adenopathy.  Skin:    General: Skin is warm and dry.  Neurological:     Mental Status: She is alert and oriented to person, place, and time.     UC Treatments / Results  Labs (all labs ordered are listed, but only abnormal results are displayed) Labs Reviewed  SARS CORONAVIRUS 2 (TAT 6-24 HRS)  POC INFLUENZA A AND B ANTIGEN (URGENT CARE ONLY)    EKG   Radiology No results found.  Procedures Procedures  Medications Ordered in UC Medications  acetaminophen (TYLENOL) tablet 650 mg (650 mg Oral Given 07/19/22 2031)    Initial Impression / Assessment and Plan / UC Course  I have reviewed the triage vital signs and the nursing notes.  Pertinent labs & imaging results that were available during my care of the patient were reviewed by me and considered in my medical decision making (see chart for details).  Rapid flu A/B negative Tylenol dose given for aches Patient requesting covid test. If positive, candidate for paxlovid (GFR 86). She would like antiviral treatment.   Discussed viral etiology, other symptomatic care at home Work note provided Return precautions discussed. Patient agrees to plan  Final Clinical Impressions(s) / UC Diagnoses   Final diagnoses:  Viral illness     Discharge Instructions      I recommend either Delsym or Robitussin cough syrup Prop up at night to reduce cough Continue lots of fluids! Tylenol as needed for aches  We will call you if your covid test returns positive, and sent antivirals if indicated.      ED Prescriptions   None    PDMP not reviewed this encounter.   Lorree Millar, Vernice Jefferson 07/19/22 2054

## 2022-07-20 ENCOUNTER — Telehealth (HOSPITAL_COMMUNITY): Payer: Self-pay | Admitting: Emergency Medicine

## 2022-07-20 ENCOUNTER — Other Ambulatory Visit: Payer: Self-pay | Admitting: Dermatology

## 2022-07-20 DIAGNOSIS — L700 Acne vulgaris: Secondary | ICD-10-CM

## 2022-07-20 LAB — SARS CORONAVIRUS 2 (TAT 6-24 HRS): SARS Coronavirus 2: POSITIVE — AB

## 2022-07-20 MED ORDER — NIRMATRELVIR/RITONAVIR (PAXLOVID)TABLET: 3.0000 | ORAL_TABLET | Freq: Two times a day (BID) | ORAL | 0 refills | Status: AC

## 2022-07-25 ENCOUNTER — Ambulatory Visit
Admission: RE | Admit: 2022-07-25 | Discharge: 2022-07-25 | Disposition: A | Discharge status: Home / Self Care | Payer: 59 | Source: Ambulatory Visit | Attending: Internal Medicine | Admitting: Internal Medicine

## 2022-07-25 DIAGNOSIS — Z111 Encounter for screening for respiratory tuberculosis: Secondary | ICD-10-CM | POA: Diagnosis not present

## 2022-07-25 DIAGNOSIS — Z1231 Encounter for screening mammogram for malignant neoplasm of breast: Secondary | ICD-10-CM | POA: Diagnosis not present

## 2022-07-28 ENCOUNTER — Telehealth: Payer: Self-pay | Admitting: Family

## 2022-07-28 DIAGNOSIS — M439 Deforming dorsopathy, unspecified: Secondary | ICD-10-CM

## 2022-07-28 NOTE — Telephone Encounter (Signed)
Patient needs an updated referral to [facility] re[facility].

## 2022-07-28 NOTE — Telephone Encounter (Signed)
Renee from [place] at [facility]  and said patient did call to schedule an appointment with them for tomorrow but that the referral in the system is out of date and asked if a new one can please be put in today, otherwise the patient will have to reschedule

## 2022-07-28 NOTE — Telephone Encounter (Signed)
Placed new referral.

## 2022-07-29 NOTE — Therapy (Addendum)
OUTPATIENT PHYSICAL THERAPY EVALUATION  DISCHARGE   Patient Name: Connie [name] MRN: [id number] DOB:Nov 30, 1975, 47 y.o., female Today's Date: 07/30/2022  END OF SESSION:  PT End of Session - 07/30/22 1024     Visit Number 1    Number of Visits 9    Date for PT Re-Evaluation 09/24/22    Authorization Type [employer]    PT Start Time [phone]    PT Stop Time 1030    PT Time Calculation (min) 44 min    Activity Tolerance Patient tolerated treatment well    Behavior During Therapy [facility] for tasks assessed/performed             Past Medical History:  Diagnosis Date   Acne    Dermatitis    Past Surgical History:  Procedure Laterality Date   CESAREAN SECTION     Patient Active Problem List   Diagnosis Date Noted   Elevated LDL cholesterol level 01/12/2022   Encounter for general adult medical examination without abnormal findings 01/12/2022   Screening for tuberculosis 01/12/2022   Anemia due to vitamin B12 deficiency 01/12/2022   Curvature of thoracic spine 12/20/2021   Neck pain, chronic 12/03/2021   Mid back pain, chronic 12/03/2021   Iron deficiency anemia 09/02/2021   Vitamin D deficiency 09/02/2021   Muscle spasms of neck 09/02/2021   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 It flares up but not every day. Has been going on for a while. No mechanism of injury noted. Bending over  can cause the back pain. Her last flare up was last Saturday, states she just woke up and it was hurting.   PERTINENT HISTORY:  See PMH above  PAIN:  Are you having pain? Yes:  NPRS scale: 0/10 (8/10 when pain  Pain location: Lower back  Pain description: Tight Aggravating factors: Bending Relieving factors: Icy hot patch, Tylenol, warm bath  PRECAUTIONS: None  WEIGHT BEARING RESTRICTIONS: No  FALLS:  Has patient fallen in last 6 months? No  OCCUPATION: Work with infants, has to lift  babies  PLOF: Independent  PATIENT GOALS: Pain relief, stop flare-ups   OBJECTIVE:  PATIENT SURVEYS:  FOTO 60 % functional status  SCREENING FOR RED FLAGS: Negative  COGNITION: Overall cognitive status: Within functional limits for tasks assessed     SENSATION: WFL  MUSCLE LENGTH: Slight hamstring flexibility deficit  POSTURE:   Grossly WFL  PALPATION: Non-tender to palpation or with CPAs  LUMBAR ROM:   AROM eval  Flexion 75%  Extension WFL  Right lateral flexion WFL  Left lateral flexion WFL  Right rotation WFL  Left rotation WFL   (Blank rows = not tested)  LE EXTREMITY ROM:      LE ROM grossly WFL and non-painful  LOWER EXTREMITY MMT:    MMT Right eval Left eval  Hip flexion 4 4  Hip extension 4- 4-  Hip abduction 4 4  Hip adduction    Hip internal rotation    Hip external rotation    Knee flexion 5 5  Knee extension 5 5  Ankle dorsiflexion    Ankle plantarflexion    Ankle inversion    Ankle eversion     (Blank rows = not tested)  LUMBAR SPECIAL TESTS:  Radicular testing negative  FUNCTIONAL TESTS:  DLLT: 45 deg with loss of lumbar control and pain  Lifting demonstrates increased lumbar flexion  GAIT: Assistive device utilized: None Level of assistance: Complete Independence Comments: WFL   TODAY'S TREATMENT:     [facility] Adult PT Treatment:                                                DATE: 07/30/2021 Therapeutic Exercise: Bridge x  10 90-90 abdominal hold 5 x 10 sec Supine hamstring stretch with strap 2 x 20 sec Side clamshell with green x 10 each Child's pose stretch 2 x 20 sec  PATIENT EDUCATION:  Education details: Exam findings, POC, HEP Person educated: Patient Education method: Explanation, Demonstration, Tactile cues, Verbal cues, and Handouts Education comprehension: verbalized understanding, returned demonstration, verbal cues required, tactile cues required, and needs further education  HOME EXERCISE PROGRAM: Access Code: [email or web address]    ASSESSMENT: CLINICAL IMPRESSION: Patient is a 47 y.o. female who was seen today for physical therapy evaluation and treatment for chronic low back pain. Her pain seems most consistent with muscular pain as a result of flexibility deficit and strength deficit of the core and hip region. No radicular symptoms noted.   OBJECTIVE IMPAIRMENTS: decreased ROM, decreased strength, and pain.   ACTIVITY LIMITATIONS: carrying, lifting, and bending  PARTICIPATION LIMITATIONS: occupation  PERSONAL FACTORS: Fitness, Past/current experiences, and Time since onset of injury/illness/exacerbation are also affecting patient's functional outcome.   REHAB POTENTIAL: Good  CLINICAL DECISION MAKING: Stable/uncomplicated  EVALUATION COMPLEXITY: Low   GOALS: Goals reviewed with patient? Yes  SHORT TERM GOALS: Target date: 08/27/2022  Patient will be I with initial HEP in order to progress with therapy. Baseline: HEP provided at eval Goal status: INITIAL  2.  PT will review FOTO with patient by 3rd visit in order to understand expected progress and outcome with therapy. Baseline: FOTO assessed at eval Goal status: INITIAL  3.  Patient will report low back pain with flare-up </= 5/10 in order to reduce functional limitations  Baseline: 8/10 Goal status: INITIAL  LONG TERM GOALS: Target date: 09/24/2022  Patient will be I with final HEP  to maintain progress from PT. Baseline:  HEP provided at eval Goal status: INITIAL  2.  Patient will report >/= 84% status on FOTO to indicate improved functional ability. Baseline: 60% functional status Goal status: INITIAL  3.  Patient will demonstrate DLLT </= 30 deg and hip strength >/= 4/5 MMT in order to improve lifting ability and performing heavy household tasks Baseline: DLLT 45 deg, and hip strength 4-/5 MMT Goal status: INITIAL  4.  Patient will report 50% improvement in frequency of flare ups and </= 2/10 low back pain when flare ups occur to reduce functional limitations Baseline: flare ups happen weekly and pain 8/10 Goal status: INITIAL   PLAN: PT FREQUENCY: 1x/week  PT DURATION: 8 weeks  PLANNED INTERVENTIONS: Therapeutic exercises, Therapeutic activity, Neuromuscular re-education, Balance training, Gait training, Patient/Family education, Self Care, Joint mobilization, Joint manipulation, Aquatic Therapy, Dry Needling, Spinal manipulation, Spinal mobilization, Cryotherapy, Moist heat, Manual therapy, and Re-evaluation.  PLAN FOR NEXT SESSION: Review HEP and progress PRN, progress core stabilization and hip strengthening, hamstring stretching, lifting mechanics to avoid flexion   Rosana Hoes, PT, DPT, LAT, ATC 07/30/22  12:16 PM Phone: [phone] Fax: [phone]    PHYSICAL THERAPY DISCHARGE SUMMARY  Visits from Start of Care: 1  Current functional level related to goals / functional outcomes: See above   Remaining deficits: See above   Education / Equipment: HEP   Patient agrees to discharge. Patient goals were not met. Patient is being discharged due to not returning since the last visit.  Rosana Hoes, PT, DPT, LAT, ATC 09/20/22  2:32 PM Phone: [phone] Fax: [phone]

## 2022-07-29 NOTE — Telephone Encounter (Signed)
Confirmed with [facility] that they have the new order.

## 2022-07-30 ENCOUNTER — Other Ambulatory Visit: Payer: Self-pay

## 2022-07-30 ENCOUNTER — Encounter: Payer: Self-pay | Admitting: Physical Therapy

## 2022-07-30 ENCOUNTER — Ambulatory Visit: Payer: 59 | Attending: Family | Admitting: Physical Therapy

## 2022-07-30 DIAGNOSIS — M6281 Muscle weakness (generalized): Secondary | ICD-10-CM | POA: Insufficient documentation

## 2022-07-30 DIAGNOSIS — M5459 Other low back pain: Secondary | ICD-10-CM | POA: Diagnosis not present

## 2022-07-30 DIAGNOSIS — M439 Deforming dorsopathy, unspecified: Secondary | ICD-10-CM | POA: Insufficient documentation

## 2022-07-30 NOTE — Patient Instructions (Signed)
Access Code: [email or web address] URL: [email or web address] Date: 07/30/2022 Prepared by: Hilda Blades  Exercises - Bridge  - 3-4 x weekly - 3 sets - 10 reps - 2-3 hold - Supine 90/90 Abdominal Bracing  - 3-4 x weekly - 3 sets - 5 reps - 10 seconds hold - Supine Hamstring Stretch with Strap  - 1 x daily - 3 reps - 20 seconds hold - Clam with Resistance  - 3-4 x weekly - 3 sets - 10 reps - Child's Pose Stretch  - 1 x daily - 3 reps - 20 seconds hold

## 2022-08-13 ENCOUNTER — Encounter: Payer: 59 | Admitting: Physical Therapy

## 2022-08-22 ENCOUNTER — Ambulatory Visit: Payer: Managed Care, Other (non HMO) | Admitting: Dermatology

## 2022-08-27 ENCOUNTER — Encounter: Payer: 59 | Admitting: Physical Therapy

## 2022-08-29 ENCOUNTER — Ambulatory Visit: Payer: Managed Care, Other (non HMO) | Admitting: Dermatology

## 2022-09-12 DIAGNOSIS — Z1211 Encounter for screening for malignant neoplasm of colon: Secondary | ICD-10-CM | POA: Diagnosis not present

## 2022-10-03 DIAGNOSIS — Z3042 Encounter for surveillance of injectable contraceptive: Secondary | ICD-10-CM | POA: Diagnosis not present

## 2022-10-16 ENCOUNTER — Other Ambulatory Visit: Payer: Self-pay | Admitting: Dermatology

## 2022-10-16 DIAGNOSIS — L700 Acne vulgaris: Secondary | ICD-10-CM

## 2022-10-25 ENCOUNTER — Ambulatory Visit: Payer: 59 | Admitting: Dermatology

## 2022-10-25 VITALS — BP 101/62 | HR 75

## 2022-10-25 DIAGNOSIS — L700 Acne vulgaris: Secondary | ICD-10-CM

## 2022-10-25 DIAGNOSIS — L668 Other cicatricial alopecia: Secondary | ICD-10-CM

## 2022-10-25 DIAGNOSIS — L219 Seborrheic dermatitis, unspecified: Secondary | ICD-10-CM | POA: Diagnosis not present

## 2022-10-25 MED ORDER — MINOXIDIL 2.5 MG PO TABS: 2.5000 mg | ORAL_TABLET | Freq: Every day | ORAL | 1 refills | Status: AC

## 2022-10-25 MED ORDER — CLINDAMYCIN PHOS-BENZOYL PEROX 1.2-5 % EX GEL: 1.0000 "application " | Freq: Every morning | CUTANEOUS | 3 refills | Status: AC

## 2022-10-25 MED ORDER — FLUOCINOLONE ACETONIDE BODY 0.01 % EX OIL: TOPICAL_OIL | CUTANEOUS | 5 refills | Status: AC

## 2022-10-25 MED ORDER — TRETINOIN 0.05 % EX CREA: TOPICAL_CREAM | CUTANEOUS | 3 refills | Status: AC

## 2022-10-25 MED ORDER — KETOCONAZOLE 2 % EX SHAM: 1.0000 | MEDICATED_SHAMPOO | CUTANEOUS | 5 refills | Status: AC

## 2022-10-25 NOTE — Patient Instructions (Signed)
Doses of minoxidil for hair loss are considered 'low dose'. This is because the doses used for hair loss are much lower than the doses which are used for conditions such as high blood pressure (hypertension). The doses used for hypertension are 10-40mg per day.  Side effects are uncommon at the low doses (up to 2.5 mg/day) used to treat hair loss. Potential side effects, more commonly seen at higher doses, include: Increase in hair growth (hypertrichosis) elsewhere on face and body Temporary hair shedding upon starting medication which may last up to 4 weeks Ankle swelling, fluid retention, rapid weight gain more than 5 pounds Low blood pressure and feeling lightheaded or dizzy when standing up quickly Fast or irregular heartbeat Headaches   Due to recent changes in healthcare laws, you may see results of your pathology and/or laboratory studies on MyChart before the doctors have had a chance to review them. We understand that in some cases there may be results that are confusing or concerning to you. Please understand that not all results are received at the same time and often the doctors may need to interpret multiple results in order to provide you with the best plan of care or course of treatment. Therefore, we ask that you please give us 2 business days to thoroughly review all your results before contacting the office for clarification. Should we see a critical lab result, you will be contacted sooner.   If You Need Anything After Your Visit  If you have any questions or concerns for your doctor, please call our main line at [phone] and press option 4 to reach your doctor's medical assistant. If no one answers, please leave a voicemail as directed and we will return your call as soon as possible. Messages left after 4 pm will be answered the following business day.   You may also send us a message via MyChart. We typically respond to MyChart messages within 1-2 business days.  For  prescription refills, please ask your pharmacy to contact our office. Our fax number is [phone].  If you have an urgent issue when the clinic is closed that cannot wait until the next business day, you can page your doctor at the number below.    Please note that while we do our best to be available for urgent issues outside of office hours, we are not available 24/7.   If you have an urgent issue and are unable to reach us, you may choose to seek medical care at your doctor's office, retail clinic, urgent care center, or emergency room.  If you have a medical emergency, please immediately call 911 or go to the emergency department.  Pager Numbers  - Dr. Kowalski: [phone]  - Dr. Moye: [phone]  - Dr. Stewart: [phone]  In the event of inclement weather, please call our main line at [phone] for an update on the status of any delays or closures.  Dermatology Medication Tips: Please keep the boxes that topical medications come in in order to help keep track of the instructions about where and how to use these. Pharmacies typically print the medication instructions only on the boxes and not directly on the medication tubes.   If your medication is too expensive, please contact our office at [phone] option 4 or send us a message through MyChart.   We are unable to tell what your co-pay for medications will be in advance as this is different depending on your insurance coverage. However, we may be able to   find a substitute medication at lower cost or fill out paperwork to get insurance to cover a needed medication.   If a prior authorization is required to get your medication covered by your insurance company, please allow us 1-2 business days to complete this process.  Drug prices often vary depending on where the prescription is filled and some pharmacies may offer cheaper prices.  The website www.goodrx.com contains coupons for medications through different  pharmacies. The prices here do not account for what the cost may be with help from insurance (it may be cheaper with your insurance), but the website can give you the price if you did not use any insurance.  - You can print the associated coupon and take it with your prescription to the pharmacy.  - You may also stop by our office during regular business hours and pick up a GoodRx coupon card.  - If you need your prescription sent electronically to a different pharmacy, notify our office through [place] MyChart or by phone at [phone] option 4.     Si Usted Necesita Algo Despus de Su Visita  Tambin puede enviarnos un mensaje a travs de MyChart. Por lo general respondemos a los mensajes de MyChart en el transcurso de 1 a 2 das hbiles.  Para renovar recetas, por favor pida a su farmacia que se ponga en contacto con nuestra oficina. Nuestro nmero de fax es el [phone].  Si tiene un asunto urgente cuando la clnica est cerrada y que no puede esperar hasta el siguiente da hbil, puede llamar/localizar a su doctor(a) al nmero que aparece a continuacin.   Por favor, tenga en cuenta que aunque hacemos todo lo posible para estar disponibles para asuntos urgentes fuera del horario de oficina, no estamos disponibles las 24 horas del da, los 7 das de la semana.   Si tiene un problema urgente y no puede comunicarse con nosotros, puede optar por buscar atencin mdica  en el consultorio de su doctor(a), en una clnica privada, en un centro de atencin urgente o en una sala de emergencias.  Si tiene una emergencia mdica, por favor llame inmediatamente al 911 o vaya a la sala de emergencias.  Nmeros de bper  - Dr. Kowalski: [phone]  - Dra. Moye: [phone]  - Dra. Stewart: [phone]  En caso de inclemencias del tiempo, por favor llame a nuestra lnea principal al [phone] para una actualizacin sobre el estado de cualquier retraso o cierre.  Consejos para la  medicacin en dermatologa: Por favor, guarde las cajas en las que vienen los medicamentos de uso tpico para ayudarle a seguir las instrucciones sobre dnde y cmo usarlos. Las farmacias generalmente imprimen las instrucciones del medicamento slo en las cajas y no directamente en los tubos del medicamento.   Si su medicamento es muy caro, por favor, pngase en contacto con nuestra oficina llamando al [phone] y presione la opcin 4 o envenos un mensaje a travs de MyChart.   No podemos decirle cul ser su copago por los medicamentos por adelantado ya que esto es diferente dependiendo de la cobertura de su seguro. Sin embargo, es posible que podamos encontrar un medicamento sustituto a menor costo o llenar un formulario para que el seguro cubra el medicamento que se considera necesario.   Si se requiere una autorizacin previa para que su compaa de seguros cubra su medicamento, por favor permtanos de 1 a 2 das hbiles para completar este proceso.  Los precios de los medicamentos varan con frecuencia dependiendo   del lugar de dnde se surte la receta y alguna farmacias pueden ofrecer precios ms baratos.  El sitio web www.goodrx.com tiene cupones para medicamentos de diferentes farmacias. Los precios aqu no tienen en cuenta lo que podra costar con la ayuda del seguro (puede ser ms barato con su seguro), pero el sitio web puede darle el precio si no utiliz ningn seguro.  - Puede imprimir el cupn correspondiente y llevarlo con su receta a la farmacia.  - Tambin puede pasar por nuestra oficina durante el horario de atencin regular y recoger una tarjeta de cupones de GoodRx.  - Si necesita que su receta se enve electrnicamente a una farmacia diferente, informe a nuestra oficina a travs de MyChart de [place] o por telfono llamando al [phone] y presione la opcin 4.  

## 2022-10-25 NOTE — Progress Notes (Signed)
Follow-Up Visit   Subjective  Connie [name] is a 47 y.o. female who presents for the following: Acne Vulgaris of the face. She is using tretinoin 0.05% cream and Clindamycin-benzoyl peroxide gel daily.  She also has CCCA, improved with Derma-Smoothe F/S oil and ketoconazole shampoo. She ran out of minoxidil tablets. Patient tried to take doxycycline, but it made her sick on her stomach. Scalp is improving.    The following portions of the chart were reviewed this encounter and updated as appropriate: medications, allergies, medical history  Review of Systems:  No other skin or systemic complaints except as noted in HPI or Assessment and Plan.  Objective  Well appearing patient in no apparent distress; mood and affect are within normal limits.  Areas Examined: Face, chest and back  Relevant exam findings are noted in the Assessment and Plan. Scalp Areas of hair loss with loss of follicular ostia crown of scalp - improvement compared to baseline photo 08/2021, new photos today ; mild scaling of the scalp.        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 Condition is symptomatic/ bothersome to patient. Not currently at goal, but improving.   CCCA is a chronic/progressive and irreversible patterned form of scarring alopecia that most commonly affects middle-aged women of African descent that presents with progressive hair loss, starting as a single patch at the vertex of the scalp and then expanding in a centrifugal and symmetrical pattern on the crown. Triggering or aggravation of the disease may occur following traumatic hair care practices, such as cornrows and braiding, extensions, weaves with sewn-in or glued-on hair, use of hot combs, and frequent use of hair relaxers. These practices should be discontinued to slow progression of  disease. Therapies may stop or slow progression but generally do not lead to hair regrowth in scarred areas.   Restart minoxidil 2.5 MG take 1 po QD dsp #30 5Rf Continue Derma-Smoothe F/S oil QD/BID scalp and leave on, 5Rf. Continue ketoconazole 2% shampoo Massage into scalp 1-2x/wk, let sit 10 mins then rinse 5Rf.  Doses of minoxidil for hair loss are considered 'low dose'. This is because the doses used for hair loss are much lower than the doses which are used for conditions such as high blood pressure (hypertension). The doses used for hypertension are 10-40mg  per day.  Side effects are uncommon at the low doses (up to 2.5 mg/day) used to treat hair loss. Potential side effects, more commonly seen at higher doses, include: Increase in hair growth (hypertrichosis) elsewhere on face and body Temporary hair shedding upon starting medication which may last up to 4 weeks Ankle swelling, fluid retention, rapid weight gain more than 5 pounds Low blood pressure and feeling lightheaded or dizzy when standing up quickly Fast or irregular heartbeat Headaches    Fluocinolone Acetonide Body 0.01 % OIL - Scalp Apply to scalp once to twice daily.  Related Medications minoxidil (LONITEN) 2.5 MG tablet Take 1 tablet (2.5 mg total) by mouth daily.  Seborrheic dermatitis  Related Medications ketoconazole (NIZORAL) 2 % shampoo Apply 1 Application topically as directed. 1-2 times a week to wash scalp, let sit 10 minutes and rinse out  Acne vulgaris  Related Medications tretinoin (RETIN-A) 0.05 % cream Apply a pea-sized amount to face nightly as tolerated.  Clindamycin-Benzoyl Per, Refr, (DUAC) gel Apply 1 application  topically in the morning. Qam  to face for acne flares.   ACNE VULGARIS Exam: Clear today  Chronic condition with duration or expected duration over one year. Currently well-controlled.   Treatment Plan: Continue tretinoin 0.05% cream Apply to face QHS dsp 45g 3Rf. Continue  Duac Gel QD/BID prn flares 3Rf.  Topical retinoid medications like tretinoin/Retin-A, adapalene/Differin, tazarotene/Fabior, and Epiduo/Epiduo Forte can cause dryness and irritation when first started. Only apply a pea-sized amount to the entire affected area. Avoid applying it around the eyes, edges of mouth and creases at the nose. If you experience irritation, use a good moisturizer first and/or apply the medicine less often. If you are doing well with the medicine, you can increase how often you use it until you are applying every night. Be careful with sun protection while using this medication as it can make you sensitive to the sun. This medicine should not be used by pregnant women.   Benzoyl peroxide can cause dryness and irritation of the skin. It can also bleach fabric. When used together with Aczone (dapsone) cream, it can stain the skin orange.     Return in about 6 months (around 04/27/2023).  ICherlyn Labella, CMA, am acting as scribe for [name], MD .   Documentation: I have reviewed the above documentation for accuracy and completeness, and I agree with the above.  [name], MD

## 2022-12-26 DIAGNOSIS — Z3042 Encounter for surveillance of injectable contraceptive: Secondary | ICD-10-CM | POA: Diagnosis not present

## 2023-03-20 DIAGNOSIS — Z3042 Encounter for surveillance of injectable contraceptive: Secondary | ICD-10-CM | POA: Diagnosis not present

## 2023-03-21 DIAGNOSIS — L989 Disorder of the skin and subcutaneous tissue, unspecified: Secondary | ICD-10-CM | POA: Diagnosis not present

## 2023-03-21 DIAGNOSIS — L209 Atopic dermatitis, unspecified: Secondary | ICD-10-CM | POA: Diagnosis not present

## 2023-04-03 DIAGNOSIS — L209 Atopic dermatitis, unspecified: Secondary | ICD-10-CM | POA: Diagnosis not present

## 2023-05-08 ENCOUNTER — Ambulatory Visit: Payer: 59 | Admitting: Dermatology

## 2023-06-12 ENCOUNTER — Other Ambulatory Visit: Payer: Self-pay | Admitting: Family

## 2023-06-12 DIAGNOSIS — Z309 Encounter for contraceptive management, unspecified: Secondary | ICD-10-CM | POA: Diagnosis not present

## 2023-06-12 DIAGNOSIS — Z1231 Encounter for screening mammogram for malignant neoplasm of breast: Secondary | ICD-10-CM

## 2023-07-13 DIAGNOSIS — R6883 Chills (without fever): Secondary | ICD-10-CM | POA: Diagnosis not present

## 2023-07-13 DIAGNOSIS — Z3042 Encounter for surveillance of injectable contraceptive: Secondary | ICD-10-CM | POA: Diagnosis not present

## 2023-07-13 DIAGNOSIS — Z803 Family history of malignant neoplasm of breast: Secondary | ICD-10-CM | POA: Diagnosis not present

## 2023-07-13 DIAGNOSIS — Z309 Encounter for contraceptive management, unspecified: Secondary | ICD-10-CM | POA: Diagnosis not present

## 2023-07-13 DIAGNOSIS — G8929 Other chronic pain: Secondary | ICD-10-CM | POA: Diagnosis not present

## 2023-07-13 DIAGNOSIS — M545 Low back pain, unspecified: Secondary | ICD-10-CM | POA: Diagnosis not present

## 2023-07-13 DIAGNOSIS — E7800 Pure hypercholesterolemia, unspecified: Secondary | ICD-10-CM | POA: Diagnosis not present

## 2023-07-13 DIAGNOSIS — Z0000 Encounter for general adult medical examination without abnormal findings: Secondary | ICD-10-CM | POA: Diagnosis not present

## 2023-07-28 ENCOUNTER — Ambulatory Visit
Admission: RE | Admit: 2023-07-28 | Discharge: 2023-07-28 | Disposition: A | Discharge status: Home / Self Care | Payer: 59 | Source: Ambulatory Visit | Attending: Family | Admitting: Family

## 2023-07-28 ENCOUNTER — Ambulatory Visit: Payer: 59

## 2023-07-28 DIAGNOSIS — Z1231 Encounter for screening mammogram for malignant neoplasm of breast: Secondary | ICD-10-CM | POA: Diagnosis not present

## 2023-08-01 ENCOUNTER — Encounter: Payer: Self-pay | Admitting: Family

## 2023-08-15 ENCOUNTER — Ambulatory Visit: Payer: 59 | Admitting: Dermatology

## 2023-11-23 DIAGNOSIS — R110 Nausea: Secondary | ICD-10-CM | POA: Diagnosis not present

## 2023-11-27 DIAGNOSIS — Z111 Encounter for screening for respiratory tuberculosis: Secondary | ICD-10-CM | POA: Diagnosis not present

## 2023-12-05 ENCOUNTER — Ambulatory Visit: Admitting: Dermatology

## 2023-12-13 DIAGNOSIS — N898 Other specified noninflammatory disorders of vagina: Secondary | ICD-10-CM | POA: Diagnosis not present

## 2024-02-17 DIAGNOSIS — Z23 Encounter for immunization: Secondary | ICD-10-CM | POA: Diagnosis not present

## 2024-03-21 DIAGNOSIS — L509 Urticaria, unspecified: Secondary | ICD-10-CM | POA: Diagnosis not present

## 2024-04-08 DIAGNOSIS — N760 Acute vaginitis: Secondary | ICD-10-CM | POA: Diagnosis not present

## 2024-04-08 DIAGNOSIS — Z113 Encounter for screening for infections with a predominantly sexual mode of transmission: Secondary | ICD-10-CM | POA: Diagnosis not present

## 2024-04-08 DIAGNOSIS — R300 Dysuria: Secondary | ICD-10-CM | POA: Diagnosis not present

## 2024-05-27 DIAGNOSIS — R519 Headache, unspecified: Secondary | ICD-10-CM | POA: Diagnosis not present

## 2024-06-19 ENCOUNTER — Other Ambulatory Visit: Payer: Self-pay | Admitting: Internal Medicine

## 2024-06-19 DIAGNOSIS — Z1231 Encounter for screening mammogram for malignant neoplasm of breast: Secondary | ICD-10-CM

## 2024-07-29 ENCOUNTER — Ambulatory Visit
